# Patient Record
Sex: Female | Born: 1965 | Race: White | Hispanic: No | Marital: Single | Smoking: Current every day smoker
Health system: Southern US, Community
[De-identification: ages and names within clinical notes are randomized; demographics above are authoritative.]

## PROBLEM LIST (undated history)

## (undated) DIAGNOSIS — C801 Malignant (primary) neoplasm, unspecified: Secondary | ICD-10-CM

## (undated) HISTORY — DX: Malignant (primary) neoplasm, unspecified: C80.1

## (undated) HISTORY — PX: ABDOMINAL HYSTERECTOMY: SHX81

## (undated) HISTORY — PX: ELBOW SURGERY: SHX618

---

## 2005-01-28 ENCOUNTER — Emergency Department: Payer: Self-pay | Admitting: Emergency Medicine

## 2005-01-29 ENCOUNTER — Emergency Department (HOSPITAL_COMMUNITY): Admission: EM | Admit: 2005-01-29 | Discharge: 2005-01-29 | Payer: Self-pay | Admitting: Emergency Medicine

## 2006-05-04 ENCOUNTER — Ambulatory Visit: Payer: Self-pay | Admitting: Internal Medicine

## 2010-10-03 HISTORY — PX: AUGMENTATION MAMMAPLASTY: SUR837

## 2013-11-07 ENCOUNTER — Emergency Department: Payer: Self-pay | Admitting: Emergency Medicine

## 2014-07-04 ENCOUNTER — Emergency Department: Payer: Self-pay | Admitting: Emergency Medicine

## 2014-09-17 ENCOUNTER — Ambulatory Visit: Payer: Self-pay

## 2015-10-04 HISTORY — PX: MASTECTOMY: SHX3

## 2015-10-04 HISTORY — PX: AUGMENTATION MAMMAPLASTY: SUR837

## 2015-11-23 ENCOUNTER — Ambulatory Visit: Payer: Self-pay

## 2015-11-25 ENCOUNTER — Ambulatory Visit: Payer: Self-pay | Attending: Oncology

## 2015-11-25 ENCOUNTER — Other Ambulatory Visit: Payer: Self-pay | Admitting: Oncology

## 2015-11-25 ENCOUNTER — Ambulatory Visit
Admission: RE | Admit: 2015-11-25 | Discharge: 2015-11-25 | Disposition: A | Discharge status: Home / Self Care | Payer: Self-pay | Source: Ambulatory Visit | Attending: Oncology | Admitting: Oncology

## 2015-11-25 VITALS — BP 137/87 | HR 102 | Temp 99.6°F | Resp 16 | Ht 66.54 in | Wt 152.2 lb

## 2015-11-25 DIAGNOSIS — Z0000 Encounter for general adult medical examination without abnormal findings: Secondary | ICD-10-CM

## 2015-11-25 NOTE — Progress Notes (Signed)
Subjective:     Patient ID: Abigail Reeves, female   DOB: Feb 12, 1966, 50 y.o.   MRN: [id number]  HPI   Review of Systems     Objective:   Physical Exam  Pulmonary/Chest: Right breast exhibits no inverted nipple, no mass, no nipple discharge, no skin change and no tenderness. Left breast exhibits no inverted nipple, no mass, no nipple discharge, no skin change and no tenderness. Breasts are symmetrical.    Bilateral breast implants       Assessment:    50 year oldpatient presents for BCCCP clinic visit.  Patient screened, and meets BCCCP eligibility.  Patient does not have insurance, Medicare or Medicaid.  Handout given on Affordable Care Act.  Instructed patient on breast self-exam using teach back method.  CBE unremarkable. No mass or lump palpated.   Patient has strong family history of cancer. Mother, maternal aunt had breast cancer. Mother also had pancreatic cancer.  Interested in genetic testing.  Planning to establish care with  primary care physician to obtain referral to [facility] here.    Plan:     Sent for bilateral screening mammogram.

## 2015-12-02 ENCOUNTER — Other Ambulatory Visit: Payer: Self-pay

## 2015-12-02 DIAGNOSIS — N63 Unspecified lump in unspecified breast: Secondary | ICD-10-CM

## 2015-12-11 ENCOUNTER — Ambulatory Visit
Admission: RE | Admit: 2015-12-11 | Discharge: 2015-12-11 | Disposition: A | Discharge status: Home / Self Care | Payer: Self-pay | Source: Ambulatory Visit | Attending: Oncology | Admitting: Oncology

## 2015-12-11 DIAGNOSIS — N63 Unspecified lump in unspecified breast: Secondary | ICD-10-CM

## 2015-12-14 ENCOUNTER — Other Ambulatory Visit: Payer: Self-pay | Admitting: *Deleted

## 2015-12-14 DIAGNOSIS — N63 Unspecified lump in unspecified breast: Secondary | ICD-10-CM

## 2015-12-16 ENCOUNTER — Ambulatory Visit
Admission: RE | Admit: 2015-12-16 | Discharge: 2015-12-16 | Disposition: A | Discharge status: Home / Self Care | Payer: Self-pay | Source: Ambulatory Visit | Attending: Oncology | Admitting: Oncology

## 2015-12-16 DIAGNOSIS — N63 Unspecified lump in unspecified breast: Secondary | ICD-10-CM

## 2015-12-16 HISTORY — PX: BREAST BIOPSY: SHX20

## 2015-12-17 ENCOUNTER — Telehealth: Payer: Self-pay | Admitting: *Deleted

## 2015-12-17 NOTE — Telephone Encounter (Signed)
Received called pathology report from [facility], RN that the right breast biopsy was invasive mammary carcinoma, grade 3, with presence of DCIS.  ER/PR/Her2 will be sent for testing.  Called and informed patient of her results.  Asked if she'd prefer to come in for her results or over the phone.  Patient wanted them via phone today.  She is scheduled to see Dr. Grayland Ormond on Wednesday, March 22 @ 4:00.  Informed her we would completed Medicaid paperwork at that time and discuss genetic testing.  She is to call if she has any questions or needs.

## 2015-12-18 DIAGNOSIS — Z853 Personal history of malignant neoplasm of breast: Secondary | ICD-10-CM | POA: Insufficient documentation

## 2015-12-23 ENCOUNTER — Encounter: Payer: Self-pay | Admitting: Oncology

## 2015-12-23 ENCOUNTER — Encounter (INDEPENDENT_AMBULATORY_CARE_PROVIDER_SITE_OTHER): Payer: Self-pay

## 2015-12-23 ENCOUNTER — Inpatient Hospital Stay: Payer: Medicaid Other | Attending: Oncology | Admitting: Oncology

## 2015-12-23 VITALS — BP 156/92 | HR 101 | Temp 98.9°F | Ht 66.5 in | Wt 149.8 lb

## 2015-12-23 DIAGNOSIS — C50319 Malignant neoplasm of lower-inner quadrant of unspecified female breast: Secondary | ICD-10-CM

## 2015-12-23 DIAGNOSIS — C50311 Malignant neoplasm of lower-inner quadrant of right female breast: Secondary | ICD-10-CM | POA: Diagnosis present

## 2015-12-23 DIAGNOSIS — Z171 Estrogen receptor negative status [ER-]: Secondary | ICD-10-CM | POA: Diagnosis not present

## 2015-12-23 DIAGNOSIS — F17210 Nicotine dependence, cigarettes, uncomplicated: Secondary | ICD-10-CM | POA: Diagnosis not present

## 2015-12-23 LAB — SURGICAL PATHOLOGY

## 2015-12-23 NOTE — Progress Notes (Signed)
No other concerns other than breast cancer

## 2015-12-25 ENCOUNTER — Ambulatory Visit: Payer: Self-pay

## 2015-12-25 ENCOUNTER — Encounter: Payer: Self-pay | Admitting: General Surgery

## 2015-12-25 ENCOUNTER — Ambulatory Visit (INDEPENDENT_AMBULATORY_CARE_PROVIDER_SITE_OTHER): Payer: Medicaid Other | Admitting: General Surgery

## 2015-12-25 VITALS — BP 124/78 | HR 78 | Resp 14 | Ht 66.0 in | Wt 150.0 lb

## 2015-12-25 DIAGNOSIS — C50919 Malignant neoplasm of unspecified site of unspecified female breast: Secondary | ICD-10-CM | POA: Insufficient documentation

## 2015-12-25 DIAGNOSIS — N6001 Solitary cyst of right breast: Secondary | ICD-10-CM | POA: Diagnosis not present

## 2015-12-25 DIAGNOSIS — N63 Unspecified lump in breast: Secondary | ICD-10-CM | POA: Diagnosis not present

## 2015-12-25 DIAGNOSIS — N6310 Unspecified lump in the right breast, unspecified quadrant: Secondary | ICD-10-CM

## 2015-12-25 DIAGNOSIS — C50911 Malignant neoplasm of unspecified site of right female breast: Secondary | ICD-10-CM | POA: Diagnosis not present

## 2015-12-25 DIAGNOSIS — N6009 Solitary cyst of unspecified breast: Secondary | ICD-10-CM | POA: Insufficient documentation

## 2015-12-25 NOTE — Progress Notes (Signed)
Patient ID: Abigail Reeves, female   DOB: 1966/05/13, 50 y.o.   MRN: [id number]  Chief Complaint  Patient presents with  . Other    eval breast cancer    HPI Abigail Reeves is a 50 y.o. female. who presents for a breast evaluation. The most recent mammogram and right breast ultrasound was done on 12/11/15 and right breast biopsy done on 12/16/15. Patient does perform regular self breast checks and gets regular mammograms done. Prior mammogram was in fall 2015. Strong family history of breast cancer.  The patient had been evaluated by Delight Hoh, M.D. for medical oncology earlier this week. His notes are not complete at the time of today's visit.  The patient reports she has 2 children, a son and daughter, the son lives in [place] and the daughter lives locally.  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  While her father attended her today, he was not involved in the interview or consultation.    HPI  No past medical history on file.  Past Surgical History  Procedure Laterality Date  . Augmentation mammaplasty Bilateral 2012  . Elbow surgery Left   . Abdominal hysterectomy      Family History  Problem Relation Age of Onset  . Breast cancer Mother     14  . Pancreatic cancer Mother     37  . Breast cancer Maternal Aunt     63  . Breast cancer Maternal Grandmother     ?    Social History Social History  Substance Use Topics  . Smoking status: Current Every Day Smoker -- 1.00 packs/day for 25 years    Types: Cigarettes  . Smokeless tobacco: None  . Alcohol Use: 12.0 oz/week    20 Cans of beer per week    No Known Allergies  No current outpatient prescriptions on file.   No current facility-administered medications for this visit.    Review of Systems Review of Systems  Constitutional: Negative.   Respiratory: Negative.   Cardiovascular: Negative.     Blood pressure 124/78, pulse 78, resp. rate 14, height  5\' 6"  (1.676 m), weight 150 lb (68.04 kg), last menstrual period 11/24/1997.  Physical Exam Physical Exam  Constitutional: She is oriented to person, place, and time. She appears well-developed and well-nourished.  Eyes: Conjunctivae are normal. No scleral icterus.  Neck: Neck supple.  Cardiovascular: Normal rate, regular rhythm and normal heart sounds.   Pulmonary/Chest: Effort normal. She has wheezes in the left lower field. Right breast exhibits mass. Right breast exhibits no inverted nipple, no nipple discharge, no skin change and no tenderness. Left breast exhibits no inverted nipple, no mass, no nipple discharge, no skin change and no tenderness.    Right breast 6 o'clock bruising .  Lymphadenopathy:    She has no cervical adenopathy.    She has no axillary adenopathy.  Neurological: She is alert and oriented to person, place, and time.  Skin: Skin is dry.    Data Reviewed Screening mammograms dated 11/25/2015 suggested asymmetry in the inferior aspect of the right breast for which additional views were suggested. BI-RADS-0.  Diagnostic mammograms dated 12/11/2015 the just the focal asymmetry with a few calcifications. Multiple circumscribed masses noted throughout the right breast.  Ultrasound of the same date showed a lesion at the 5:00 position 3 cm from the nipple which was hypoechoic and adjacent area in aggregate measuring 0.8 x 1.1 x 1.7 cm. The axilla was negative.  BI-RADS-4C. Ultrasound-guided biopsy completed 12/11/2015 in postbiopsy images were reviewed.  Pathology showed a histologic grade 3 triple negative tumor measuring up to 0.7 cm in the submitted sample.  Ultrasound examination of the right breast was completed to help determine surgical options. There are several cystic lesions noted throughout the breast. In the axillary tail the 11:00 position, 8 cm from the nipple at the end of a mildly prominent duct is a 0.3 x 0.56 x 0.74 hypoechoic/anechoic lesion with  posterior acoustic enhancement. This is in continuity with an adjacent duct. At the 4:00 position 2 cm from the nipple a anechoic area without discernible enhancement measures 0.2 x 0.5 x 0.5 cm. In the upper-outer quadrant the duct dilatation is noted up to 0.28 cm.  At the 3:00 position 1 cm from the nipple a 0.3 x 0.46 x 0.6 cm anechoic lesion consistent with a simple cyst is identified. In the 12:00 position a well-defined anechoic nodule posterior acoustic shadowing measuring 0.35 x 0.38 x 0.43 cm is appreciated.  At the 6:00 position at the site of recent biopsy, 2 cm from the nipple a 0.8 x 1.25 x 1.74 cm mass is appreciated this abuts the pectoralis fascia. No discernible shadowing. Site of previous biopsy. BI-RADS-6.    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 This has previously been scheduled by Dr. Grayland Ormond for next week.  Options for surgical management were reviewed. Considering the patient had implants placed 3 years ago the idea of mastectomy without reconstruction as unappealing. The multiple lesions throughout the right breast are likely cystic and options for management would include: 1) wide excision and postoperative radiation recognizing possible hardening of the capsule of the prosthesis and need for subsequent removal versus 2) mastectomy with or without nipple sparing followed by exchange of the implant for a larger size making radiation unnecessary. ( intraoperative assessment of the retroareolar tissue would be required to determine if nipple sparing was appropriate).  Role of sentinel node biopsy needed procedure was discussed.  The patient brought up contralateral mastectomy. The consensus report from the [employer] was reviewed stating that there is no indication for contralateral mastectomy unless cancer was identified.  At the present time the patient is  leaning towards mastectomy with immediate reconstruction.  Arrangements will be made for plastic surgery consultation. Based on the results of her MRI for the left breast would make plans for a right skin sparing mastectomy with immediate reconstruction at a convenient date.  Approximately one hour was spent reviewing treatment options.        This information has been scribed by Gaspar Cola CMA.  PCP:  Cephas Darby, Forest Gleason 12/25/2015, 9:24 AM

## 2015-12-30 ENCOUNTER — Encounter: Payer: Self-pay | Admitting: General Surgery

## 2015-12-30 ENCOUNTER — Ambulatory Visit (INDEPENDENT_AMBULATORY_CARE_PROVIDER_SITE_OTHER): Payer: Medicaid Other | Admitting: General Surgery

## 2015-12-30 VITALS — BP 136/88 | HR 88 | Resp 12 | Ht 66.0 in | Wt 147.0 lb

## 2015-12-30 DIAGNOSIS — C50911 Malignant neoplasm of unspecified site of right female breast: Secondary | ICD-10-CM | POA: Diagnosis not present

## 2015-12-30 NOTE — Progress Notes (Addendum)
Patient ID: Abigail Reeves, female   DOB: [birth year], 50 y.o.   MRN: [id number]  Chief Complaint  Patient presents with  . Other    discussion    HPI Abigail Reeves is a 50 y.o. female.  Here today to discuss manage of breat cancer. She is here today with her best friend Abigail Reeves.  HPI  No past medical history on file.  Past Surgical History  Procedure Laterality Date  . Augmentation mammaplasty Bilateral 2012  . Elbow surgery Left   . Abdominal hysterectomy    . Breast biopsy Right 12-16-15    INVASIVE MAMMARY CARCINOMA OF NO SPECIAL TYPE    Family History  Problem Relation Age of Onset  . Breast cancer Mother     82  . Pancreatic cancer Mother     30  . Breast cancer Maternal Aunt     63  . Breast cancer Maternal Grandmother     ?    Social History Social History  Substance Use Topics  . Smoking status: Current Every Day Smoker -- 1.00 packs/day for 25 years    Types: Cigarettes  . Smokeless tobacco: Never Used  . Alcohol Use: 12.0 oz/week    20 Cans of beer per week    No Known Allergies  No current outpatient prescriptions on file.   No current facility-administered medications for this visit.    Review of Systems Review of Systems  Constitutional: Negative.   Respiratory: Negative.   Cardiovascular: Negative.     Blood pressure 136/88, pulse 88, resp. rate 12, height 5\' 6"  (1.676 m), weight 147 lb (66.679 kg), last menstrual period 11/24/1997.  Physical Exam Physical Exam Not performed.  Data Reviewed No new data.   Assessment    Breast cancer.      Plan    Plastic surgery not available locally secondary to [facility] insurance.  Options reviewed: Referral to university setting (patient's family had good [facility] at [facility]) Mastectomy w/ o implant exchange, likely resulting in a 1/2 to 1 cup size discrepancy  Lumpectomy w/ post op RT. 30% chance of capsular contraction of present prosthesis.  Patient has requested university  referral. This will be coordinated with the [place] team.      PCP:  Frazier Richards This information has been scribed by Karie Fetch RN, BSN,BC.    Robert Bellow 01/11/2016, 8:55 PM

## 2015-12-30 NOTE — Progress Notes (Signed)
  Oncology Nurse Navigator Documentation  Navigator Location: [facility]-Med Onc (12/30/15 1200) Navigator Encounter Type: Initial MedOnc;Telephone;Clinic/MDC;Education;Genetics (12/30/15 1200)   Abnormal Finding Date: 12/16/15 (12/30/15 1200) Confirmed Diagnosis Date: 12/16/15 (12/30/15 1200)     Patient Visit Type: MedOnc;Initial;Surgery (12/30/15 1200) Treatment Phase: Pre-Tx/Tx Discussion (12/30/15 1200) Barriers/Navigation Needs: Financial;Family concerns;Education;Coordination of Care (12/30/15 1200) Education: Accessing Care/ Finding Providers;Understanding Cancer/ Treatment Options;Coping with Diagnosis/ Prognosis;Preparing for Upcoming Surgery/ Treatment;Newly Diagnosed Cancer Education;Concerns with Insurance Coverage;Concerns with Finances/ Eligibility (12/30/15 1200) Interventions: Referrals;Coordination of Care;Education Method (12/30/15 1200)     Education Method: Teach-back;Verbal;Written;Demonstration (12/30/15 1200)      Acuity: Level 3 (12/30/15 1200)     Acuity Level 3: Coordination of multimodality treatment;Emotional needs;Ongoing guidance and education provided throughout treatment (12/30/15 1200)   Time Spent with Patient: > 120 (12/30/15 1200)   Patient came through [facility] prior to diagnosis.  Met with her, and family members at initial Med Onc visit.  Given Brast Cancer Treatment [facility]And book.  Coordinating B[facility] Medicaid, genetic testing. Going for MUGA, and MRI this week.

## 2015-12-31 ENCOUNTER — Encounter: Payer: Self-pay | Admitting: Oncology

## 2015-12-31 ENCOUNTER — Ambulatory Visit
Admission: RE | Admit: 2015-12-31 | Discharge: 2015-12-31 | Disposition: A | Discharge status: Home / Self Care | Payer: Medicaid Other | Source: Ambulatory Visit | Attending: Oncology | Admitting: Oncology

## 2015-12-31 DIAGNOSIS — C50319 Malignant neoplasm of lower-inner quadrant of unspecified female breast: Secondary | ICD-10-CM | POA: Insufficient documentation

## 2015-12-31 MED ORDER — TECHNETIUM TC 99M-LABELED RED BLOOD CELLS IV KIT: 20.0000 | PACK | Freq: Once | INTRAVENOUS | Status: DC | PRN

## 2015-12-31 NOTE — Progress Notes (Signed)
  Oncology Nurse Navigator Documentation  Navigator Location: [facility] Onc (12/31/15 1700) Navigator Encounter Type: Genetics;Education (Financial) (12/31/15 1700)           Patient Visit Type: Follow-up (12/31/15 1700) Treatment Phase: Pre-Tx/Tx Discussion (12/31/15 1700) Barriers/Navigation Needs: Financial;Education;Coordination of Care (12/31/15 1700) Education: Concerns with Finances/ Eligibility;Concerns with Insurance Coverage (genetics) (12/31/15 1700)       Education Method: Written;Verbal;Teach-back (12/31/15 1700)      Acuity: Level 2 (12/31/15 1700)   Acuity Level 2: Initial guidance, education and coordination as needed;Referrals such as genetics, survivorship;Ongoing guidance and education throughout treatment as needed (12/31/15 1700)     Time Spent with Patient: 60 (12/31/15 1700)   Genetic testing for MYRISK obtained, and [facility] paperwork completed.  Patient scheduled for breast MRI tomorrow.

## 2016-01-01 ENCOUNTER — Encounter: Payer: Self-pay | Admitting: Oncology

## 2016-01-01 ENCOUNTER — Ambulatory Visit (HOSPITAL_COMMUNITY)
Admission: RE | Admit: 2016-01-01 | Discharge: 2016-01-01 | Disposition: A | Discharge status: Home / Self Care | Payer: Medicaid Other | Source: Ambulatory Visit | Attending: Oncology | Admitting: Oncology

## 2016-01-01 ENCOUNTER — Other Ambulatory Visit: Payer: Self-pay | Admitting: Oncology

## 2016-01-01 DIAGNOSIS — C50319 Malignant neoplasm of lower-inner quadrant of unspecified female breast: Secondary | ICD-10-CM

## 2016-01-01 MED ORDER — GADOBENATE DIMEGLUMINE 529 MG/ML IV SOLN
15.0000 mL | Freq: Once | INTRAVENOUS | Status: AC | PRN
  Administered 2016-01-01: 14 mL via INTRAVENOUS

## 2016-01-04 ENCOUNTER — Encounter: Payer: Self-pay | Admitting: Diagnostic Radiology

## 2016-01-05 NOTE — Progress Notes (Signed)
[place]  Telephone:([phone]) [phone] Fax:(336) [phone]  ID: LY WASS OB: Sep 27, 1966  MR#: [id number]  [id number]#:[id number]  Patient Care Team: Kirk Ruths, MD as PCP - General (Internal Medicine) Rico Junker, RN as Registered Nurse Theodore Demark, RN as Registered Nurse Lloyd Huger, MD as Consulting Physician (Oncology) Robert Bellow, MD (General Surgery)  CHIEF COMPLAINT:  Chief Complaint  Patient presents with  . New Evaluation    Breast cancer Right side    INTERVAL HISTORY: Patient is a 50 year old female with history of bilateral breast implants who was found to have an abnormality on routine screening mammogram. 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 Currently, she is anxious but otherwise feels well. She has no neurologic complaints. She denies any recent fevers or illnesses. She has good appetite and denies weight loss. She denies any pain. She has no chest pain or shortness of breath. She denies any nausea, vomiting, constipation, or diarrhea. She has no urinary complaints. Patient offers no further specific complaints.  REVIEW OF SYSTEMS:   Review of Systems  Constitutional: Negative.  Negative for fever, weight loss and malaise/fatigue.  Respiratory: Negative.  Negative for shortness of breath.   Cardiovascular: Negative.  Negative for chest pain.  Gastrointestinal: Negative.   Genitourinary: Negative.   Musculoskeletal: Negative.   Neurological: Negative.  Negative for weakness.  Psychiatric/Behavioral: The patient is nervous/anxious.     As per HPI. Otherwise, a complete review of systems is negatve.  PAST MEDICAL HISTORY: No past medical history on file.  PAST SURGICAL HISTORY: Past Surgical History  Procedure Laterality Date  . Augmentation mammaplasty Bilateral 2012  . Elbow surgery Left   . Abdominal hysterectomy    . Breast biopsy Right 12-16-15    INVASIVE MAMMARY  CARCINOMA OF NO SPECIAL TYPE    FAMILY HISTORY Family History  Problem Relation Age of Onset  . Breast cancer Mother     11  . Pancreatic cancer Mother     38  . Breast cancer Maternal Aunt     63  . Breast cancer Maternal Grandmother     ?       ADVANCED DIRECTIVES:    HEALTH MAINTENANCE: Social History  Substance Use Topics  . Smoking status: Current Every Day Smoker -- 1.00 packs/day for 25 years    Types: Cigarettes  . Smokeless tobacco: Never Used  . Alcohol Use: 12.0 oz/week    20 Cans of beer per week     Colonoscopy:  PAP:  Bone density:  Lipid panel:  No Known Allergies  No current outpatient prescriptions on file.   No current facility-administered medications for this visit.    OBJECTIVE: Filed Vitals:   12/23/15 1622  BP: 156/92  Pulse: 101  Temp: 98.9 F (37.2 C)     Body mass index is 23.82 kg/(m^2).    ECOG FS:0 - Asymptomatic  General: Well-developed, well-nourished, no acute distress. Eyes: Pink conjunctiva, anicteric sclera. HEENT: Normocephalic, moist mucous membranes, clear oropharnyx. Breasts: Bilateral breast implants. Lungs: Clear to auscultation bilaterally. Heart: Regular rate and rhythm. No rubs, murmurs, or gallops. Abdomen: Soft, nontender, nondistended. No organomegaly noted, normoactive bowel sounds. Musculoskeletal: No edema, cyanosis, or clubbing. Neuro: Alert, answering all questions appropriately. Cranial nerves grossly intact. Skin: No rashes or petechiae noted. Psych: Normal affect. Lymphatics: No cervical, calvicular, axillary or inguinal LAD.   LAB RESULTS:  No results found for: NA, K, CL, CO2, GLUCOSE, BUN, CREATININE, CALCIUM,  PROT, ALBUMIN, AST, ALT, ALKPHOS, BILITOT, GFRNONAA, GFRAA  No results found for: WBC, NEUTROABS, HGB, HCT, MCV, PLT   STUDIES: Nm Cardiac Muga Rest  12/31/2015  CLINICAL DATA:  Breast cancer. Evaluate cardiac function in relation to chemotherapy. EXAM: NUCLEAR MEDICINE CARDIAC  BLOOD POOL IMAGING (MUGA) TECHNIQUE: Cardiac multi-gated acquisition was performed at rest following intravenous injection of Tc-9mlabeled red blood cells. RADIOPHARMACEUTICALS:  20.0 mCi Tc-99mDP in-vitro labeled red blood cells IV COMPARISON:  None. FINDINGS: No  focal wall motion abnormality of the left ventricle. Calculated left ventricular ejection fraction equals 67% IMPRESSION: Left ventricular ejection fraction equals67% Electronically Signed   By: StSuzy Bouchard.D.   On: 12/31/2015 15:58   Mr Breast Bilateral W Wo Contrast  01/04/2016  CLINICAL DATA:  Recent diagnosis of invasive mammary carcinoma of the right breast following ultrasound-guided biopsy of a 1.1 cm mass at 5 o'clock position. LABS:  None performed EXAM: BILATERAL BREAST MRI WITH AND WITHOUT CONTRAST TECHNIQUE: Multiplanar, multisequence MR images of both breasts were obtained prior to and following the intravenous administration of 18 ml of MultiHance. THREE-DIMENSIONAL MR IMAGE RENDERING ON INDEPENDENT WORKSTATION: Three-dimensional MR images were rendered by post-processing of the original MR data on an independent workstation. The three-dimensional MR images were interpreted, and findings are reported in the following complete MRI report for this study. Three dimensional images were evaluated at the independent DynaCad workstation COMPARISON:  Previous exam(s). FINDINGS: Breast composition: c. Heterogeneous fibroglandular tissue. Background parenchymal enhancement: Moderate. Right breast: There is a 1.2 x 1.1 x 1.0 cm irregular enhancing mass with central biopsy clip artifact in the 5 o'clock region of the right breast. Biopsy clip artifact is seen within the mass. No additional suspicious masses in the right breast. Left breast: No suspicious mass or suspicious area of enhancement. Lymph nodes: No abnormal appearing lymph nodes. Ancillary findings: Bilateral subpectoral silicone breast implants are present. Please note that this  study is not tailored to evaluate for integrity of the implants. IMPRESSION: 1.2 x 1.1 x 1.0 cm biopsy-proven malignancy at 5 o'clock region of the right breast. No MRI evidence of malignancy in the left breast. Negative for lymphadenopathy. RECOMMENDATION: Treatment planning. BI-RADS CATEGORY  6: Known biopsy-proven malignancy. Electronically Signed   By: SuCurlene Dolphin.D.   On: 01/04/2016 11:18   Mm Digital Diagnostic Unilat R  12/16/2015  CLINICAL DATA:  Status post ultrasound-guided core biopsy of mass in the right breast. EXAM: DIAGNOSTIC RIGHT MAMMOGRAM POST ULTRASOUND BIOPSY COMPARISON:  Previous exam(s). FINDINGS: Mammographic images were obtained following ultrasound guided biopsy of a mass in the 5 o'clock region of the right breast. Mammographic images show there is a ribbon shaped clip associated with mass in the 5 o'clock region of the right breast. IMPRESSION: Status post ultrasound-guided core biopsy of a right breast mass with pathology pending. Final Assessment: Post Procedure Mammograms for Marker Placement Electronically Signed   By: DiLillia Mountain.D.   On: 12/16/2015 10:46   UsKoreareast Complete Uni Right Inc Axilla  12/25/2015  Ultrasound examination of the right breast was completed to help determine surgical options. There are several cystic lesions noted throughout the breast. In the axillary tail the 11:00 position, 8 cm from the nipple at the end of a mildly prominent duct is a 0.3 x 0.56 x 0.74 hypoechoic/anechoic lesion with posterior acoustic enhancement. This is in continuity with an adjacent duct. At the 4:00 position 2 cm from the nipple a anechoic area without discernible enhancement measures  0.2 x 0.5 x 0.5 cm. In the upper-outer quadrant the duct dilatation is noted up to 0.28 cm. At the 3:00 position 1 cm from the nipple a 0.3 x 0.46 x 0.6 cm anechoic lesion consistent with a simple cyst is identified. In the 12:00 position a well-defined anechoic nodule posterior acoustic  shadowing measuring 0.35 x 0.38 x 0.43 cm is appreciated. At the 6:00 position at the site of recent biopsy, 2 cm from the nipple a 0.8 x 1.25 x 1.74 cm mass is appreciated this abuts the pectoralis fascia. No discernible shadowing. Site of previous biopsy. BI-RADS-6.   US Breast Ltd Uni Right Inc Axilla  12/11/2015  CLINICAL DATA:  Screening recall for a possible right breast mass. The patient has strong family history for breast cancer. EXAM: DIGITAL DIAGNOSTIC RIGHT MAMMOGRAM WITH 3D TOMOSYNTHESIS AND CAD RIGHT BREAST ULTRASOUND COMPARISON:  Previous exam(s). ACR Breast Density Category c: The breast tissue is heterogeneously dense, which may obscure small masses. FINDINGS: In the slightly lower inner quadrant of the right breast, middle depth there is a focal asymmetry/possible mass associated with a few calcifications. Several circumscribed masses are seen elsewhere throughout the right breast. Mammographic images were processed with CAD. Physical exam of the lower-inner quadrant of the right breast demonstrates a firm palpable mass. Ultrasound targeted to the palpable area of concern at 5 o'clock, 3 cm from the nipple demonstrates a hypoechoic mass with indistinct margins and several echogenic internal foci corresponding with the calcifications seen mammographically. This mass measures approximately 1.1 x 1.1 x 0.8 cm in corresponds with the mammographic mass of concern. There is an adjacent hypoechoic oval mass which may represent a cyst, though the possibility that this is a part of the larger mass cannot be excluded. If this smaller mass is included with the measurements with the dominant larger mass, then the mass would measure 1.1 x 0.8 x 1.7 cm. The right axilla was imaged demonstrating normal appearing lymph nodes. IMPRESSION: 1.  There is a suspicious mass in the right breast at 5 o'clock. 2.  No evidence of right axillary lymphadenopathy. RECOMMENDATION: 1. Ultrasound-guided biopsy is recommended  for the right breast mass at 5 o'clock. 2. Following ultrasound-guided biopsy, bilateral breast MRI is recommended for this high risk patient with strong family history of breast cancer and dense breast tissue. Additionally, per American Cancer Society guidelines, if the patient has a calculated lifetime risk of developing breast cancer of greater than 20%, annual screening MRI of the breasts would be recommended at the time of screening mammography. I have discussed the findings and recommendations with the patient. Results were also provided in writing at the conclusion of the visit. If applicable, a reminder letter will be sent to the patient regarding the next appointment. BI-RADS CATEGORY  Category 4C: High suspicion for malignancy. Electronically Signed   By: Ammie Ferrier M.D.   On: 12/11/2015 11:30   Mm Diag Breast Tomo Uni Right  12/11/2015  CLINICAL DATA:  Screening recall for a possible right breast mass. The patient has strong family history for breast cancer. EXAM: DIGITAL DIAGNOSTIC RIGHT MAMMOGRAM WITH 3D TOMOSYNTHESIS AND CAD RIGHT BREAST ULTRASOUND COMPARISON:  Previous exam(s). ACR Breast Density Category c: The breast tissue is heterogeneously dense, which may obscure small masses. FINDINGS: In the slightly lower inner quadrant of the right breast, middle depth there is a focal asymmetry/possible mass associated with a few calcifications. Several circumscribed masses are seen elsewhere throughout the right breast. Mammographic images were processed with  CAD. Physical exam of the lower-inner quadrant of the right breast demonstrates a firm palpable mass. Ultrasound targeted to the palpable area of concern at 5 o'clock, 3 cm from the nipple demonstrates a hypoechoic mass with indistinct margins and several echogenic internal foci corresponding with the calcifications seen mammographically. This mass measures approximately 1.1 x 1.1 x 0.8 cm in corresponds with the mammographic mass of  concern. There is an adjacent hypoechoic oval mass which may represent a cyst, though the possibility that this is a part of the larger mass cannot be excluded. If this smaller mass is included with the measurements with the dominant larger mass, then the mass would measure 1.1 x 0.8 x 1.7 cm. The right axilla was imaged demonstrating normal appearing lymph nodes. IMPRESSION: 1.  There is a suspicious mass in the right breast at 5 o'clock. 2.  No evidence of right axillary lymphadenopathy. RECOMMENDATION: 1. Ultrasound-guided biopsy is recommended for the right breast mass at 5 o'clock. 2. Following ultrasound-guided biopsy, bilateral breast MRI is recommended for this high risk patient with strong family history of breast cancer and dense breast tissue. Additionally, per American Cancer Society guidelines, if the patient has a calculated lifetime risk of developing breast cancer of greater than 20%, annual screening MRI of the breasts would be recommended at the time of screening mammography. I have discussed the findings and recommendations with the patient. Results were also provided in writing at the conclusion of the visit. If applicable, a reminder letter will be sent to the patient regarding the next appointment. BI-RADS CATEGORY  Category 4C: High suspicion for malignancy. Electronically Signed   By: Ammie Ferrier M.D.   On: 12/11/2015 11:30   [place] Rt Breast Bx W Loc Dev 1st Lesion Img Bx Spec US Guide  12/28/2015  ADDENDUM REPORT: 12/18/2015 16:52 ADDENDUM: Pathology of the right breast biopsy revealed INVASIVE MAMMARY CARCINOMA, PRELIMINARY GRADE 3. Results were called to Wynelle Link, nurse in [facility] by pathologist. The results were relayed to Tanya Nones, RN, nurse navigator for the [facility] This was found to be concordant by Dr. Clarise Cruz impression and notes. Recommendations:  Surgical and oncology referral. At the patient's request, she was contacted by phone on 12/18/15  by Jetta Lout, [facility] radiology). She stated she has a bruise but is otherwise fine. Post biopsy instructions were reviewed with the patient. All of her questions were answered. She was encouraged to call the [place] [facility] with any further questions or concerns related to the biopsy site. The patient stated results have been discussed with her. She saw Dr. Ouida Sills today and has an appointment with the oncologist on 12/23/15 at 4:00 PM. Addendum by Jetta Lout, [place] on 12/18/15. Electronically Signed   By: Lillia Mountain M.D.   On: 12/18/2015 16:52  12/28/2015  CLINICAL DATA:  Suspicious right breast mass. EXAM: ULTRASOUND GUIDED RIGHT BREAST CORE NEEDLE BIOPSY COMPARISON:  Previous exam(s). PROCEDURE: I met with the patient and we discussed the procedure of ultrasound-guided biopsy, including benefits and alternatives. We discussed the high likelihood of a successful procedure. We discussed the risks of the procedure including infection, bleeding, tissue injury, clip migration, and inadequate sampling. Informed written consent was given. The usual time-out protocol was performed immediately prior to the procedure. Using sterile technique and 1% Lidocaine as local anesthetic, under direct ultrasound visualization, a 12 gauge vacuum-assisted device was used to perform biopsy of a mass in the 5 o'clock region of  the right breastusing a lateral to medial approach. At the conclusion of the procedure, a ribbon shaped tissue marker clip was deployed into the biopsy cavity. Follow-up 2-view mammogram was performed and dictated separately. IMPRESSION: Ultrasound-guided biopsy of the right breast. No apparent complications. Electronically Signed: By: Lillia Mountain M.D. 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  PLAN:    1. Breast cancer: MRI results reviewed independently and reported as above confirming size of  malignancy. A referral was made to surgery for definitive lumpectomy. Patient may require surgery at a tertiary care center given her breast implants and desire for repeat augmentation. She has an appointment at [facility] on January 13, 2016 with Dr. Stasia Cavalier. Given the fact the patient has a triple negative tumor, she will require adjuvant chemotherapy using Adriamycin, Cytoxan, and Taxol. She also will likely require adjuvant XRT depending on her surgery. Aromatase inhibitor would not be helpful given the triple negative status of her disease. Pretreatment MUGA scan revealed an EF of 67%. Patient will return to clinic in several weeks after her surgery for further evaluation and additional treatment planning. 2. Genetic testing: Given the fact that patient is a 50 years old and triple negative tumor she would qualify for genetic testing. She also has a significant family history of maternal grandmother, maternal aunt as well as a mother all with reported breast cancer.  Approximately 45 minutes was spent in discussion of which greater than 50% was consultation.  Patient expressed understanding and was in agreement with this plan. She also understands that She can call clinic at any time with any questions, concerns, or complaints.    Lloyd Huger, MD   01/05/2016 1:45 PM

## 2016-01-06 ENCOUNTER — Telehealth: Payer: Self-pay | Admitting: *Deleted

## 2016-01-06 NOTE — Progress Notes (Signed)
See Oncology Navigator note.  Copy to [facility].

## 2016-01-06 NOTE — Telephone Encounter (Signed)
Patient was contacted today because she originally stated that she would like a referral to [facility] and we were going to inform her of her appointment date and time.   The patient states that she has already been scheduled for an appointment at [facility] and this was arranged by the folks at the [facility].   Patient will be seeing Dr. Drue Dun on 01-13-16 at [facility].   She is requesting records be forwarded to their office for her appointment. Patient will stop by the office and sign a medical release so we can fax records to their office. She will also obtain fax number of where information needs to be sent.   *Appointment with Dr. Genia Hotter at [facility] has been cancelled per patient's request as she will be going to [facility] for care.

## 2016-01-13 DIAGNOSIS — Z1379 Encounter for other screening for genetic and chromosomal anomalies: Secondary | ICD-10-CM | POA: Insufficient documentation

## 2016-01-13 DIAGNOSIS — Z803 Family history of malignant neoplasm of breast: Secondary | ICD-10-CM | POA: Insufficient documentation

## 2016-01-18 NOTE — Progress Notes (Signed)
  Oncology Nurse Navigator Documentation  Navigator Location: [facility] (01/18/16 1500) Navigator Encounter Type: Genetics (01/18/16 1500)           Patient Visit Type: Follow-up (01/18/16 1500) Treatment Phase: Pre-Tx/Tx Discussion (01/18/16 1500) Barriers/Navigation Needs: Education (01/18/16 1500) Education: Preparing for Upcoming Surgery/ Treatment (01/18/16 1500)       Education Method: Verbal (01/18/16 1500)      Acuity: Level 2 (01/18/16 1500)     Acuity Level 3: Emotional needs;Coordination of multimodality treatment;Ongoing guidance and education provided throughout treatment (01/18/16 1500)   Time Spent with Patient: 30 (01/18/16 1500)  Notified patient of negative MYRISK results.  Continuing to work with DSS related to [facility].  Emailed Marisa Sprinkles at [employer]. She states information has been sent to the [place] office , but approval not yet received. She will follow-up later this week, and will contact [employer] if no results.   Explained to patient.  Per patient Dr. Stasia Cavalier at [facility] is waiting for [facility] results, and Medicaid before scheduling surgery.

## 2016-01-25 ENCOUNTER — Encounter: Payer: Self-pay | Admitting: *Deleted

## 2016-01-25 NOTE — Progress Notes (Signed)
Patient called to see if her Medicaid had been approved.  Called Erskine Squibb, RN, [facility] nurse consultant, and patient was approved on 01/11/16 for 1 year.  Notified patient.  States she has not received anything yet.  Will notify local DSS.

## 2016-02-01 HISTORY — PX: BREAST EXCISIONAL BIOPSY: SUR124

## 2016-02-16 NOTE — Progress Notes (Signed)
  Oncology Nurse Navigator Documentation  Navigator Location: [facility] On[facility] (02/16/16 1000)         Surgery Date: 02/12/16 (02/16/16 1000) Treatment Initiated Date: 02/12/16 (02/16/16 1000) Patient Visit Type: Follow-up (02/16/16 1000) Treatment Phase: Active Tx (02/16/16 1000) Barriers/Navigation Needs: Financial;Coordination of Care (02/16/16 1000)   Interventions: Coordination of Care (02/16/16 1000)   Coordination of Care:  (Smokin Cessation) (02/16/16 1000)      Specialty Items/DME: Prosthesis (02/16/16 1000) Acuity: Level 2 (02/16/16 1000)   Acuity Level 2: Ongoing guidance and education throughout treatment as needed;Referrals such as genetics, survivorship;Assistance expediting appointments;Educational needs (02/16/16 1000)     Time Spent with Patient: 30 (02/16/16 1000)  Phoned patient post mastectomy.  States she is doing better than expected, and has decreased need for pain medication.   She has post surgical appointment this afternoon at [facility].  Reminded her of pre-chemotherapy appointment with Dr. Grayland Ormond on 02/22/16.  Patient states she "has to do somenting about smoking".  Discussed available Smoking Cessation class with patient, and Jeb Levering.   Per Raquel Sarna, next class in July, but he will be glad to get her started with information, and recommendations to aid cessation.  Mailed flier, and contact information.

## 2016-02-22 ENCOUNTER — Inpatient Hospital Stay: Payer: Medicaid Other | Admitting: Oncology

## 2016-02-24 ENCOUNTER — Inpatient Hospital Stay: Payer: Medicaid Other | Admitting: Oncology

## 2016-03-01 ENCOUNTER — Inpatient Hospital Stay: Payer: Medicaid Other | Attending: Oncology | Admitting: Oncology

## 2016-03-01 VITALS — BP 131/87 | HR 98 | Temp 98.2°F | Resp 18 | Wt 153.4 lb

## 2016-03-01 DIAGNOSIS — Z803 Family history of malignant neoplasm of breast: Secondary | ICD-10-CM | POA: Insufficient documentation

## 2016-03-01 DIAGNOSIS — Z79899 Other long term (current) drug therapy: Secondary | ICD-10-CM | POA: Diagnosis not present

## 2016-03-01 DIAGNOSIS — C50911 Malignant neoplasm of unspecified site of right female breast: Secondary | ICD-10-CM | POA: Insufficient documentation

## 2016-03-01 DIAGNOSIS — Z171 Estrogen receptor negative status [ER-]: Secondary | ICD-10-CM | POA: Insufficient documentation

## 2016-03-01 DIAGNOSIS — F17210 Nicotine dependence, cigarettes, uncomplicated: Secondary | ICD-10-CM | POA: Insufficient documentation

## 2016-03-01 DIAGNOSIS — C50319 Malignant neoplasm of lower-inner quadrant of unspecified female breast: Secondary | ICD-10-CM

## 2016-03-01 NOTE — Progress Notes (Signed)
Patient has been evaluated at [facility] oncology and plans to pursue treatment at [facility].

## 2016-03-08 NOTE — Progress Notes (Signed)
[facility]  [phone] [phone]) [phone]  ID: Abigail Reeves OB: October 02, 1966  MR#: [id number]  [id number]  Patient Care Team: Kirk Ruths, MD as PCP - General (Internal Medicine) Rico Junker, RN as Registered Nurse Theodore Demark, RN as Registered Nurse Lloyd Huger, MD as Consulting Physician (Oncology) Robert Bellow, MD (General Surgery)  CHIEF COMPLAINT:  Chief Complaint  Patient presents with  . Breast Cancer    INTERVAL HISTORY: Patient returns to clinic for further evaluation she currently feels well and is asymptomatic. She has no neurologic complaints. She denies any recent fevers or illnesses. She has good appetite and denies weight loss. She denies any pain. She has no chest pain or shortness of breath. She denies any nausea, vomiting, constipation, or diarrhea. She has no urinary complaints. Patient offers no further specific complaints.  REVIEW OF SYSTEMS:   Review of Systems  Constitutional: Negative.  Negative for fever, weight loss and malaise/fatigue.  Respiratory: Negative.  Negative for shortness of breath.   Cardiovascular: Negative.  Negative for chest pain.  Gastrointestinal: Negative.   Genitourinary: Negative.   Musculoskeletal: Negative.   Neurological: Negative.  Negative for weakness.  Psychiatric/Behavioral: The patient is not nervous/anxious.     As per HPI. Otherwise, a complete review of systems is negatve.  PAST MEDICAL HISTORY: No past medical history on file.  PAST SURGICAL HISTORY: Past Surgical History  Procedure Laterality Date  . Augmentation mammaplasty Bilateral 2012  . Elbow surgery Left   . Abdominal hysterectomy    . Breast biopsy Right 12-16-15    INVASIVE MAMMARY CARCINOMA OF NO SPECIAL TYPE    FAMILY HISTORY Family History  Problem Relation Age of Onset  . Breast cancer Mother     72  . Pancreatic cancer Mother     57  . Breast cancer Maternal Aunt     63    . Breast cancer Maternal Grandmother     ?       ADVANCED DIRECTIVES:    HEALTH MAINTENANCE: Social History  Substance Use Topics  . Smoking status: Current Every Day Smoker -- 1.00 packs/day for 25 years    Types: Cigarettes  . Smokeless tobacco: Never Used  . Alcohol Use: 12.0 oz/week    20 Cans of beer per week     Colonoscopy:  PAP:  Bone density:  Lipid panel:  No Known Allergies  Current Outpatient Prescriptions  Medication Sig Dispense Refill  . dexamethasone (DECADRON) 4 MG tablet     . lidocaine-prilocaine (EMLA) cream     . LORazepam (ATIVAN) 1 MG tablet     . ondansetron (ZOFRAN) 8 MG tablet Take by mouth.    . oxyCODONE (OXY IR/ROXICODONE) 5 MG immediate release tablet TK 1 TO 2 TS PO Q 4 H PRN P FOR UP TO 4 DAYS  0  . prochlorperazine (COMPAZINE) 10 MG tablet Take by mouth.    . sulfamethoxazole-trimethoprim (BACTRIM DS,SEPTRA DS) 800-160 MG tablet TK 2 TS PO BID FOR 7 DAYS  0   No current facility-administered medications for this visit.    OBJECTIVE: Filed Vitals:   03/01/16 1617  BP: 131/87  Pulse: 98  Temp: 98.2 F (36.8 C)  Resp: 18     Body mass index is 24.78 kg/(m^2).    ECOG FS:0 - Asymptomatic  General: Well-developed, well-nourished, no acute distress. Eyes: Pink conjunctiva, anicteric sclera. Breasts: Bilateral breast implants. Lungs: Clear to auscultation bilaterally. Heart: Regular rate and rhythm.  No rubs, murmurs, or gallops. Abdomen: Soft, nontender, nondistended. No organomegaly noted, normoactive bowel sounds. Musculoskeletal: No edema, cyanosis, or clubbing. Neuro: Alert, answering all questions appropriately. Cranial nerves grossly intact. Skin: No rashes or petechiae noted. Psych: Normal affect.    LAB RESULTS:  No results found for: NA, K, CL, CO2, GLUCOSE, BUN, CREATININE, CALCIUM, PROT, ALBUMIN, AST, ALT, ALKPHOS, BILITOT, GFRNONAA, GFRAA  No results found for: WBC, NEUTROABS, HGB, HCT, MCV, PLT   STUDIES: No  results found.  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  PLAN:    1. Breast cancer: Patient had consultation at [facility] and has elected to transfer all of her care there. No further intervention is needed. No follow-up has been scheduled.   2. Genetic testing: Given the fact that patient is 50 years old and triple negative tumor she would qualify for genetic testing. She also has a significant family history of maternal grandmother, maternal aunt as well as a mother all with reported breast cancer.   Patient expressed understanding and was in agreement with this plan. She also understands that She can call clinic at any time with any questions, concerns, or complaints.    Lloyd Huger, MD   03/08/2016 9:30 AM

## 2016-10-25 NOTE — Progress Notes (Signed)
  Oncology Nurse Navigator Documentation  Navigator Location: [facility] (10/25/16 1400)   )Navigator Encounter Type: Telephone;Letter/Fax/Email (10/25/16 1400)                     Patient Visit Type: Follow-up (10/25/16 1400)                              Time Spent with Patient: 45 (10/25/16 1400)  Received notification from [place] that [place] expires 11/30/16.  Phoned patient to discuss need for recertification.  She is scheduled for reconstructive surgery at [facility] on 11/14/16, and taes active breast cancer treatment should be complete by end of February. Eiligibility paperwork with treatment complete faxed to [facility].

## 2016-11-23 ENCOUNTER — Encounter: Payer: Self-pay | Admitting: Physician Assistant

## 2016-11-23 ENCOUNTER — Ambulatory Visit (INDEPENDENT_AMBULATORY_CARE_PROVIDER_SITE_OTHER): Payer: Managed Care, Other (non HMO) | Admitting: Physician Assistant

## 2016-11-23 VITALS — BP 106/76 | HR 88 | Temp 98.6°F | Resp 16 | Ht 66.0 in | Wt 154.8 lb

## 2016-11-23 DIAGNOSIS — Z7689 Persons encountering health services in other specified circumstances: Secondary | ICD-10-CM | POA: Diagnosis not present

## 2016-11-23 DIAGNOSIS — C50311 Malignant neoplasm of lower-inner quadrant of right female breast: Secondary | ICD-10-CM

## 2016-11-23 DIAGNOSIS — Z131 Encounter for screening for diabetes mellitus: Secondary | ICD-10-CM | POA: Diagnosis not present

## 2016-11-23 DIAGNOSIS — F1010 Alcohol abuse, uncomplicated: Secondary | ICD-10-CM | POA: Insufficient documentation

## 2016-11-23 DIAGNOSIS — H9192 Unspecified hearing loss, left ear: Secondary | ICD-10-CM | POA: Insufficient documentation

## 2016-11-23 DIAGNOSIS — Z171 Estrogen receptor negative status [ER-]: Secondary | ICD-10-CM | POA: Diagnosis not present

## 2016-11-23 DIAGNOSIS — R5383 Other fatigue: Secondary | ICD-10-CM | POA: Insufficient documentation

## 2016-11-23 DIAGNOSIS — F5101 Primary insomnia: Secondary | ICD-10-CM | POA: Insufficient documentation

## 2016-11-23 DIAGNOSIS — Z136 Encounter for screening for cardiovascular disorders: Secondary | ICD-10-CM

## 2016-11-23 DIAGNOSIS — F17209 Nicotine dependence, unspecified, with unspecified nicotine-induced disorders: Secondary | ICD-10-CM | POA: Insufficient documentation

## 2016-11-23 DIAGNOSIS — Z13220 Encounter for screening for lipoid disorders: Secondary | ICD-10-CM | POA: Diagnosis not present

## 2016-11-23 MED ORDER — CLONAZEPAM 0.5 MG PO TABS: 0.5000 mg | ORAL_TABLET | Freq: Every day | ORAL | 1 refills | Status: DC
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
## 2016-11-23 NOTE — Patient Instructions (Signed)
Insomnia Insomnia is a sleep disorder that makes it difficult to fall asleep or to stay asleep. Insomnia can cause tiredness (fatigue), low energy, difficulty concentrating, mood swings, and poor performance at work or school. There are three different ways to classify insomnia:  Difficulty falling asleep.  Difficulty staying asleep.  Waking up too early in the morning. Any type of insomnia can be long-term (chronic) or short-term (acute). Both are common. Short-term insomnia usually lasts for three months or less. Chronic insomnia occurs at least three times a week for longer than three months. What are the causes? Insomnia may be caused by another condition, situation, or substance, such as:  Anxiety.  Certain medicines.  Gastroesophageal reflux disease (GERD) or other gastrointestinal conditions.  Asthma or other breathing conditions.  Restless legs syndrome, sleep apnea, or other sleep disorders.  Chronic pain.  Menopause. This may include hot flashes.  Stroke.  Abuse of alcohol, tobacco, or illegal drugs.  Depression.  Caffeine.  Neurological disorders, such as Alzheimer disease.  An overactive thyroid (hyperthyroidism). The cause of insomnia may not be known. What increases the risk? Risk factors for insomnia include:  Gender. Women are more commonly affected than men.  Age. Insomnia is more common as you get older.  Stress. This may involve your professional or personal life.  Income. Insomnia is more common in people with lower income.  Lack of exercise.  Irregular work schedule or night shifts.  Traveling between different time zones. What are the signs or symptoms? If you have insomnia, trouble falling asleep or trouble staying asleep is the main symptom. This may lead to other symptoms, such as:  Feeling fatigued.  Feeling nervous about going to sleep.  Not feeling rested in the morning.  Having trouble concentrating.  Feeling irritable,  anxious, or depressed. How is this treated? Treatment for insomnia depends on the cause. If your insomnia is caused by an underlying condition, treatment will focus on addressing the condition. Treatment may also include:  Medicines to help you sleep.  Counseling or therapy.  Lifestyle adjustments. Follow these instructions at home:  Take medicines only as directed by your health care provider.  Keep regular sleeping and waking hours. Avoid naps.  Keep a sleep diary to help you and your health care provider figure out what could be causing your insomnia. Include:  When you sleep.  When you wake up during the night.  How well you sleep.  How rested you feel the next day.  Any side effects of medicines you are taking.  What you eat and drink.  Make your bedroom a comfortable place where it is easy to fall asleep:  Put up shades or special blackout curtains to block light from outside.  Use a white noise machine to block noise.  Keep the temperature cool.  Exercise regularly as directed by your health care provider. Avoid exercising right before bedtime.  Use relaxation techniques to manage stress. Ask your health care provider to suggest some techniques that may work well for you. These may include:  Breathing exercises.  Routines to release muscle tension.  Visualizing peaceful scenes.  Cut back on alcohol, caffeinated beverages, and cigarettes, especially close to bedtime. These can disrupt your sleep.  Do not overeat or eat spicy foods right before bedtime. This can lead to digestive discomfort that can make it hard for you to sleep.  Limit screen use before bedtime. This includes:  Watching TV.  Using your smartphone, tablet, and computer.  Stick to a   routine. This can help you fall asleep faster. Try to do a quiet activity, brush your teeth, and go to bed at the same time each night.  Get out of bed if you are still awake after 15 minutes of trying to  sleep. Keep the lights down, but try reading or doing a quiet activity. When you feel sleepy, go back to bed.  Make sure that you drive carefully. Avoid driving if you feel very sleepy.  Keep all follow-up appointments as directed by your health care provider. This is important. Contact a health care provider if:  You are tired throughout the day or have trouble in your daily routine due to sleepiness.  You continue to have sleep problems or your sleep problems get worse. Get help right away if:  You have serious thoughts about hurting yourself or someone else. This information is not intended to replace advice given to you by your health care provider. Make sure you discuss any questions you have with your health care provider. Document Released: 09/16/2000 Document Revised: 02/19/2016 Document Reviewed: 06/20/2014 Elsevier Interactive Patient Education  2017 Elsevier Inc.  

## 2016-11-24 ENCOUNTER — Telehealth: Payer: Self-pay

## 2016-11-24 LAB — COMPREHENSIVE METABOLIC PANEL
A/G RATIO: 1.7 (ref 1.2–2.2)
ALT: 15 IU/L (ref 0–32)
AST: 15 IU/L (ref 0–40)
Albumin: 4.3 g/dL (ref 3.5–5.5)
Alkaline Phosphatase: 93 IU/L (ref 39–117)
BILIRUBIN TOTAL: 0.3 mg/dL (ref 0.0–1.2)
BUN/Creatinine Ratio: 15 (ref 9–23)
BUN: 8 mg/dL (ref 6–24)
CHLORIDE: 101 mmol/L (ref 96–106)
CO2: 25 mmol/L (ref 18–29)
Calcium: 9.5 mg/dL (ref 8.7–10.2)
Creatinine, Ser: 0.55 mg/dL — ABNORMAL LOW (ref 0.57–1.00)
GFR calc Af Amer: 126 (ref >59)
GFR calc non Af Amer: 109 (ref >59)
GLOBULIN, TOTAL: 2.6 (ref 1.5–4.5)
Glucose: 100 mg/dL — ABNORMAL HIGH (ref 65–99)
POTASSIUM: 4.5 mmol/L (ref 3.5–5.2)
SODIUM: 142 mmol/L (ref 134–144)
Total Protein: 6.9 g/dL (ref 6.0–8.5)

## 2016-11-24 LAB — CBC WITH DIFFERENTIAL/PLATELET
Basophils Absolute: 0.1 10*3/uL (ref 0.0–0.2)
Basos: 0 %
EOS (ABSOLUTE): 0.5 10*3/uL — ABNORMAL HIGH (ref 0.0–0.4)
Eos: 4 %
Hematocrit: 41.7 % (ref 34.0–46.6)
Hemoglobin: 14.1 g/dL (ref 11.1–15.9)
Immature Grans (Abs): 0.1 10*3/uL (ref 0.0–0.1)
Immature Granulocytes: 1 %
LYMPHS ABS: 1.8 10*3/uL (ref 0.7–3.1)
Lymphs: 15 %
MCH: 35.7 pg — AB (ref 26.6–33.0)
MCHC: 33.8 g/dL (ref 31.5–35.7)
MCV: 106 fL — AB (ref 79–97)
MONOS ABS: 0.8 10*3/uL (ref 0.1–0.9)
Monocytes: 7 %
NEUTROS ABS: 8.6 10*3/uL — AB (ref 1.4–7.0)
NEUTROS PCT: 73 %
PLATELETS: 307 10*3/uL (ref 150–379)
RBC: 3.95 x10E6/uL (ref 3.77–5.28)
RDW: 12.6 % (ref 12.3–15.4)
WBC: 11.8 10*3/uL — ABNORMAL HIGH (ref 3.4–10.8)

## 2016-11-24 LAB — LIPID PANEL
CHOLESTEROL TOTAL: 182 mg/dL (ref 100–199)
Chol/HDL Ratio: 3.5 (ref 0.0–4.4)
HDL: 52 mg/dL (ref >39)
LDL Calculated: 96 (ref 0–99)
Triglycerides: 172 mg/dL — ABNORMAL HIGH (ref 0–149)
VLDL Cholesterol Cal: 34 (ref 5–40)

## 2016-11-24 LAB — TSH: TSH: 2.55 u[IU]/mL (ref 0.450–4.500)

## 2016-11-24 LAB — HEMOGLOBIN A1C
ESTIMATED AVERAGE GLUCOSE: 97
Hgb A1c MFr Bld: 5 % (ref 4.8–5.6)

## 2016-11-24 NOTE — Telephone Encounter (Signed)
-----   Message from Mar Daring, [place] sent at 11/24/2016 10:18 AM EST ----- All labs are WNL and stable with exception of slight elevation in WBC count. This could be a viral URI or viral infection of some sort. I do recommend [place] rechecking your CBC in 2-3 weeks to see if any improvement.

## 2016-11-24 NOTE — Telephone Encounter (Signed)
Patient advised as directed below. Patient is going to call in 2-3 weeks for the lab requisition form CBC only.  Thanks,  -Aleda Madl

## 2016-12-12 DIAGNOSIS — F17210 Nicotine dependence, cigarettes, uncomplicated: Secondary | ICD-10-CM | POA: Diagnosis not present

## 2016-12-12 DIAGNOSIS — R21 Rash and other nonspecific skin eruption: Secondary | ICD-10-CM | POA: Insufficient documentation

## 2016-12-12 NOTE — ED Triage Notes (Addendum)
Pt with itching and rash to trunk since last Thursday, has used otc meds without relief. Pt unsure of cause, states might be stress.

## 2016-12-13 ENCOUNTER — Emergency Department
Admission: EM | Admit: 2016-12-13 | Discharge: 2016-12-13 | Disposition: A | Discharge status: Home / Self Care | Payer: 59 | Attending: Emergency Medicine | Admitting: Emergency Medicine

## 2016-12-13 DIAGNOSIS — R21 Rash and other nonspecific skin eruption: Secondary | ICD-10-CM

## 2016-12-13 MED ORDER — PREDNISONE 20 MG PO TABS: 60.0000 mg | ORAL_TABLET | Freq: Once | ORAL | Status: DC

## 2016-12-13 MED ORDER — DIPHENHYDRAMINE HCL 25 MG PO CAPS: 25.0000 mg | ORAL_CAPSULE | Freq: Once | ORAL | Status: DC

## 2016-12-13 NOTE — ED Notes (Signed)
Pt verbalized she did not want BP checked prior to being discharged. Pt states she is ready to get home.

## 2016-12-13 NOTE — Discharge Instructions (Signed)
You did not want any treatment at this time for rash. Please follow-up with your oncologist as he had discussed in the morning.

## 2016-12-13 NOTE — ED Provider Notes (Signed)
[facility] Emergency Department Provider Note   ____________________________________________   First MD Initiated Contact with Patient 12/13/16 0025     (approximate)  I have reviewed the triage vital signs and the nursing notes.   HISTORY  Chief Complaint Urticaria    HPI Abigail Reeves is a 51 y.o. female who comes into the hospital today with some itching to her bilateral flanks. The patient reports that she's broken out and itching since last Thursday. She hasn't taken anything for the rash nor has she put anything on it. She reports that she uses suave. She states that she tried to put it off until she could see her oncologist tomorrow but she was itching very badly tonight. The patient denies any new foods, medications, soaps or detergents. She's never had anything like this in the past. She is going to [facility] oncology tomorrow. She reports that she's had a lot of stress recently and is unsure if it's related to the stress.   Past Medical History:  Diagnosis Date  . Cancer [facility])    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, stage 2    Patient Active Problem List   Diagnosis Date Noted  . Fatigue 11/23/2016  . Primary insomnia 11/23/2016  . Hearing loss of left ear 11/23/2016  . Tobacco use disorder, continuous 11/23/2016  . Alcohol abuse 11/23/2016  . Breast cyst 12/25/2015  . Malignant neoplasm of lower-inner quadrant of female breast ([place]) 12/18/2015    Past Surgical History:  Procedure Laterality Date  . ABDOMINAL HYSTERECTOMY    . AUGMENTATION MAMMAPLASTY Bilateral 2012  . 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 ELBOW SURGERY Left     Prior to Admission medications   Medication Sig Start Date End Date Taking? Authorizing Provider  clonazePAM (KLONOPIN) 0.5 MG tablet Take 1 tablet (0.5 mg total) by mouth at bedtime. 11/23/16   Mar Daring, PA-C    Allergies Patient has no known allergies.  Family  History  Problem Relation Age of Onset  . Breast cancer Mother     63  . Pancreatic cancer Mother     52  . Breast cancer Maternal Aunt     63  . Breast cancer Maternal Grandmother     ?  . Diabetes Father     Social History Social History  Substance Use Topics  . Smoking status: Current Every Day Smoker    Packs/day: 1.00    Years: 25.00    Types: Cigarettes  . Smokeless tobacco: Never Used  . Alcohol use 12.0 oz/week    20 Cans of beer per week    Review of Systems Constitutional: No fever/chills Eyes: No visual changes. ENT: No sore throat. Cardiovascular: Denies chest pain. Respiratory: Denies shortness of breath. Gastrointestinal: No abdominal pain.  No nausea, no vomiting.  No diarrhea.  No constipation. Genitourinary: Negative for dysuria. Musculoskeletal: Negative for back pain. Skin:  rash. Neurological: Negative for headaches, focal weakness or numbness.  10-point ROS otherwise negative.  ____________________________________________   PHYSICAL EXAM:  VITAL SIGNS: ED Triage Vitals  Enc Vitals Group     BP 12/12/16 2327 125/76     Pulse Rate 12/12/16 2327 (!) 105     Resp 12/12/16 2327 20     Temp 12/12/16 2327 97.9 F (36.6 C)     Temp Source 12/12/16 2327 Oral     SpO2 12/12/16 2327 100 %     Weight 12/12/16 2328 153 lb (  69.4 kg)     Height 12/12/16 2328 5\' 6"  (1.676 m)     Head Circumference --      Peak Flow --      Pain Score --      Pain Loc --      Pain Edu? --      Excl. in [place]? --     Constitutional: Alert and oriented. Well appearing and in mild distress. Eyes: Conjunctivae are normal. PERRL. EOMI. Head: Atraumatic. Nose: No congestion/rhinnorhea. Mouth/Throat: Mucous membranes are moist.  Oropharynx non-erythematous. Cardiovascular: Normal rate, regular rhythm. Grossly normal heart sounds.  Good peripheral circulation. Respiratory: Normal respiratory effort.  No retractions. Lungs CTAB. Gastrointestinal: Soft and nontender. No  distention.  Musculoskeletal: No lower extremity tenderness nor edema.  Positive bowel sounds. Neurologic:  Normal speech and language.  Skin:  Skin is warm, dry and intact. Erythema to the patient's left flank with some dry skin to the patient's flank, mid back and lower abdomen. Minimal maculopapular rash to the left flank as well with no pustules, no hives or urticaria. Psychiatric: Mood and affect are normal.   ____________________________________________   LABS (all labs ordered are listed, but only abnormal results are displayed)  Labs Reviewed - No data to display ____________________________________________  EKG  none ____________________________________________  RADIOLOGY  none ____________________________________________   PROCEDURES  Procedure(s) performed: None  Procedures  Critical Care performed: No  ____________________________________________   INITIAL IMPRESSION / ASSESSMENT AND PLAN / ED COURSE  Pertinent labs & imaging results that were available during my care of the patient were reviewed by me and considered in my medical decision making (see chart for details).  This is a 51 year old female who comes into the hospital today with itching and some rash to her flank and back. The patient reports that she just could not sleep and couldn't deal with it. I will give the patient some Benadryl as well as some prednisone. She will be reassessed when she's received her medication.     The nurse came back to me and reports that the patient does not want to take the medication if she does not have to. She states that she's been on multiple medications and wants to make sure she does not have shingles. She reports that she is also seeing her oncologist in the morning. I informed the patient that I was simply trying to treat her symptoms which is what she reports brought her into the hospital. At this time I am unsure if this is a reaction and was going to use the  medication to see if that would help improve some of the redness. The patient reports that she does not want to take any other medication at this time. I informed the patient at that time that she can follow-up with her doctor. The patient reports that she is ready to go home and does not want to take any other medication. She'll be discharged to home. ____________________________________________   FINAL CLINICAL IMPRESSION(S) / ED DIAGNOSES  Final diagnoses:  Rash      NEW MEDICATIONS STARTED DURING THIS VISIT:  Discharge Medication List as of 12/13/2016  1:15 AM       Note:  This document was prepared using Dragon voice recognition software and may include unintentional dictation errors.    Loney Hering, MD 12/13/16 [id number]

## 2016-12-13 NOTE — ED Notes (Signed)
Pt refusing medications at this time.  EDP notified.

## 2016-12-22 ENCOUNTER — Ambulatory Visit (INDEPENDENT_AMBULATORY_CARE_PROVIDER_SITE_OTHER): Payer: Managed Care, Other (non HMO) | Admitting: Physician Assistant

## 2016-12-22 ENCOUNTER — Encounter: Payer: Self-pay | Admitting: Physician Assistant

## 2016-12-22 VITALS — BP 120/70 | HR 103 | Temp 98.7°F | Resp 16 | Wt 159.2 lb

## 2016-12-22 DIAGNOSIS — J0110 Acute frontal sinusitis, unspecified: Secondary | ICD-10-CM | POA: Diagnosis not present

## 2016-12-22 MED ORDER — AMOXICILLIN-POT CLAVULANATE 875-125 MG PO TABS: 1.0000 | ORAL_TABLET | Freq: Two times a day (BID) | ORAL | 0 refills | Status: AC

## 2016-12-22 NOTE — Progress Notes (Signed)
Patient: Abigail Reeves Female    DOB: 08-26-1966   51 y.o.   MRN: [id number] Visit Date: 12/22/2016  Today's Provider: Trinna Post, PA-C   Chief Complaint  Patient presents with  . URI   Subjective:    URI   This is a new problem. The current episode started in the past 7 days. The problem has been gradually worsening. There has been no fever. Associated symptoms include congestion, coughing, ear pain (shooting pain sometimes), headaches, a plugged ear sensation, rhinorrhea, sinus pain, sneezing and a sore throat. Pertinent negatives include no abdominal pain, chest pain, diarrhea, nausea, vomiting or wheezing. She has tried decongestant, increased fluids and acetaminophen (Dayquil, alka seltzer plus, mucinex DM) for the symptoms. The treatment provided no relief.       No Known Allergies   Current Outpatient Prescriptions:  .  clonazePAM (KLONOPIN) 0.5 MG tablet, Take 1 tablet (0.5 mg total) by mouth at bedtime., Disp: 30 tablet, Rfl: 1  Review of Systems  Constitutional: Positive for appetite change (not hungry) and chills.  HENT: Positive for congestion, ear pain (shooting pain sometimes), postnasal drip, rhinorrhea, sinus pain, sinus pressure, sneezing, sore throat and trouble swallowing.   Respiratory: Positive for cough. Negative for chest tightness, shortness of breath and wheezing.   Cardiovascular: Negative for chest pain, palpitations and leg swelling.  Gastrointestinal: Negative for abdominal pain, diarrhea, nausea and vomiting.  Neurological: Positive for headaches.    Social History  Substance Use Topics  . Smoking status: Current Every Day Smoker    Packs/day: 1.00    Years: 25.00    Types: Cigarettes  . Smokeless tobacco: Never Used  . Alcohol use 12.0 oz/week    20 Cans of beer per week   Objective:   BP 120/70 (BP Location: Left Arm, Patient Position: Sitting, Cuff Size: Normal)   Pulse (!) 103   Temp 98.7 F (37.1 C) (Oral)   Resp 16    Wt 159 lb 3.2 oz (72.2 kg)   LMP 11/24/1997 (Exact Date)   SpO2 97%   BMI 25.70 kg/m  Vitals:   12/22/16 1038  BP: 120/70  Pulse: (!) 103  Resp: 16  Temp: 98.7 F (37.1 C)  TempSrc: Oral  SpO2: 97%  Weight: 159 lb 3.2 oz (72.2 kg)     Physical Exam  Constitutional: She is oriented to person, place, and time. She appears well-developed and well-nourished. No distress.  HENT:  Right Ear: External ear normal.  Left Ear: External ear normal.  Nose: Right sinus exhibits maxillary sinus tenderness and frontal sinus tenderness. Left sinus exhibits maxillary sinus tenderness and frontal sinus tenderness.  Mouth/Throat: Oropharynx is clear and moist. No oropharyngeal exudate, posterior oropharyngeal edema or posterior oropharyngeal erythema.  Tms opaque bilaterally   Eyes: Conjunctivae are normal. Right eye exhibits no discharge. Left eye exhibits no discharge.  Neck: Neck supple.  Cardiovascular: Normal rate and regular rhythm.   Pulmonary/Chest: Effort normal and breath sounds normal.  Lymphadenopathy:    She has cervical adenopathy.  Neurological: She is alert and oriented to person, place, and time.  Skin: Skin is warm and dry. She is not diaphoretic.  Psychiatric: She has a normal mood and affect. Her behavior is normal.        Assessment & Plan:     1. Acute non-recurrent frontal sinusitis  Will tx with antibiotics 2/2 severity of symptoms. Advised to stop smoking.   - amoxicillin-clavulanate (AUGMENTIN) 875-125 MG tablet;  Take 1 tablet by mouth 2 (two) times daily.  Dispense: 20 tablet; Refill: 0  The entirety of the information documented in the History of Present Illness, Review of Systems and Physical Exam were personally obtained by me. Portions of this information were initially documented by Ashley Royalty, CMA and reviewed by me for thoroughness and accuracy.   Return if symptoms worsen or fail to improve.        Trinna Post, PA-C  [facility]

## 2016-12-22 NOTE — Patient Instructions (Signed)

## 2016-12-23 ENCOUNTER — Ambulatory Visit (INDEPENDENT_AMBULATORY_CARE_PROVIDER_SITE_OTHER): Payer: Managed Care, Other (non HMO) | Admitting: Family Medicine

## 2016-12-23 ENCOUNTER — Encounter: Payer: Self-pay | Admitting: Family Medicine

## 2016-12-23 VITALS — BP 126/80 | HR 92 | Temp 98.5°F | Resp 16 | Wt 159.0 lb

## 2016-12-23 DIAGNOSIS — H66001 Acute suppurative otitis media without spontaneous rupture of ear drum, right ear: Secondary | ICD-10-CM

## 2016-12-23 NOTE — Progress Notes (Signed)
Patient: Abigail Reeves Female    DOB: [birth year]   51 y.o.   MRN: [id number] Visit Date: 12/23/2016  Today's Provider: Vernie Murders, PA   Chief Complaint  Patient presents with  . Ear Pain    Right ear pain   Subjective:    Otalgia   There is pain in the right ear. This is a new problem. The current episode started in the past 7 days. The problem has been gradually worsening. There has been no fever. The pain is at a severity of 8/10. Associated symptoms include coughing, ear discharge (Ear discharge this morning.), hearing loss and a sore throat. Pertinent negatives include no abdominal pain, diarrhea, headaches, neck pain, rash, rhinorrhea or vomiting.   Patient Active Problem List   Diagnosis Date Noted  . Fatigue 11/23/2016  . Primary insomnia 11/23/2016  . Hearing loss of left ear 11/23/2016  . Tobacco use disorder, continuous 11/23/2016  . Alcohol abuse 11/23/2016  . Breast cyst 12/25/2015  . 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 ABDOMINAL HYSTERECTOMY    . AUGMENTATION MAMMAPLASTY Bilateral 2012  . 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 ELBOW SURGERY Left    Family History  Problem Relation Age of Onset  . Breast cancer Mother     102  . Pancreatic cancer Mother     79  . Breast cancer Maternal Aunt     63  . Breast cancer Maternal Grandmother     ?  . Diabetes Father     No Known Allergies  Current Outpatient Prescriptions:  .  amoxicillin-clavulanate (AUGMENTIN) 875-125 MG tablet, Take 1 tablet by mouth 2 (two) times daily., Disp: 20 tablet, Rfl: 0 .  clonazePAM (KLONOPIN) 0.5 MG tablet, Take 1 tablet (0.5 mg total) by mouth at bedtime., Disp: 30 tablet, Rfl: 1  Review of Systems  Constitutional: Positive for fatigue. Negative for activity change, appetite change, chills, diaphoresis, fever and unexpected  weight change.  HENT: Positive for ear discharge (Ear discharge this morning.), ear pain, hearing loss, postnasal drip, sinus pain, sinus pressure, sore throat, tinnitus and trouble swallowing. Negative for congestion, nosebleeds, rhinorrhea and sneezing.   Eyes: Negative.   Respiratory: Positive for cough. Negative for apnea, choking, chest tightness, shortness of breath, wheezing and stridor.   Gastrointestinal: Negative.  Negative for abdominal pain, diarrhea and vomiting.  Musculoskeletal: Negative for neck pain.  Skin: Negative for rash.  Neurological: Positive for light-headedness. Negative for dizziness and headaches.    Social History  Substance Use Topics  . Smoking status: Current Every Day Smoker    Packs/day: 1.00    Years: 25.00    Types: Cigarettes  . Smokeless tobacco: Never Used  . Alcohol use 12.0 oz/week    20 Cans of beer per week   Objective:   BP 126/80 (BP Location: Left Arm, Patient Position: Sitting, Cuff Size: Normal)   Pulse 92   Temp 98.5 F (36.9 C) (Oral)   Resp 16   Wt 159 lb (72.1 kg)   LMP 11/24/1997 (Exact Date)   BMI 25.66 kg/m  Vitals:   12/23/16 1525  BP: 126/80  Pulse: 92  Resp: 16  Temp: 98.5 F (36.9 C)  TempSrc: Oral  Weight: 159 lb (72.1 kg)     Physical Exam  Constitutional: She is oriented to person, place, and time.  She appears well-developed and well-nourished. No distress.  HENT:  Head: Normocephalic and atraumatic.  Right Ear: Hearing normal.  Left Ear: Hearing and external ear normal.  Nose: Nose normal.  Diminished transillumination of sinuses but not tenderness. Right TM with extreme bulge and erythema. Pain increased to move auricle or tragus. No drainage from either ear canal. Hearing diminished on the right.  Eyes: Conjunctivae and lids are normal. Right eye exhibits no discharge. Left eye exhibits no discharge. No scleral icterus.  Neck: Neck supple.  Cardiovascular: Normal rate and regular rhythm.     Pulmonary/Chest: Effort normal. No respiratory distress.  Abdominal: Soft.  Musculoskeletal: Normal range of motion.  Lymphadenopathy:    She has no cervical adenopathy.  Neurological: She is alert and oriented to person, place, and time.  Skin: Skin is intact. No lesion and no rash noted.  Psychiatric: She has a normal mood and affect. Her speech is normal and behavior is normal. Thought content normal.      Assessment & Plan:     1. Acute suppurative otitis media of right ear without spontaneous rupture of tympanic membrane, recurrence not specified Onset with cough sore throat and sneezing over the past week. Pain in the right ear is worse but no drainage or signs of ruptured TM. No fever today. Started Augmentin yesterday for frontal sinusitis. Recommend starting Flonase nasal spray at bedtime and use Auralgan ear drops for pain prn. May add Afrin BID for 2-3 days to decongest eustachian tubes and use Tylenol or Advil prn pain. May need ENT referral for myringotomy if no better in 3-4 days. Continue the Augmentin 875-125 mg BID.       Vernie Murders, PA  [facility]

## 2017-01-11 ENCOUNTER — Encounter: Payer: Self-pay | Admitting: Physician Assistant

## 2017-01-11 NOTE — Telephone Encounter (Signed)
Abigail Reeves,  Can you put [name] in my 7 tomorrow for 30 min  JB

## 2017-01-12 ENCOUNTER — Ambulatory Visit: Payer: Self-pay | Admitting: Physician Assistant

## 2017-01-13 ENCOUNTER — Telehealth: Payer: Self-pay

## 2017-01-13 ENCOUNTER — Ambulatory Visit (INDEPENDENT_AMBULATORY_CARE_PROVIDER_SITE_OTHER): Payer: Managed Care, Other (non HMO) | Admitting: Physician Assistant

## 2017-01-13 ENCOUNTER — Encounter: Payer: Self-pay | Admitting: Physician Assistant

## 2017-01-13 VITALS — BP 126/80 | HR 108 | Temp 98.2°F | Resp 16 | Ht 66.0 in | Wt 154.6 lb

## 2017-01-13 DIAGNOSIS — R05 Cough: Secondary | ICD-10-CM

## 2017-01-13 DIAGNOSIS — R059 Cough, unspecified: Secondary | ICD-10-CM

## 2017-01-13 DIAGNOSIS — F5101 Primary insomnia: Secondary | ICD-10-CM | POA: Diagnosis not present

## 2017-01-13 DIAGNOSIS — F418 Other specified anxiety disorders: Secondary | ICD-10-CM

## 2017-01-13 DIAGNOSIS — J069 Acute upper respiratory infection, unspecified: Secondary | ICD-10-CM | POA: Diagnosis not present

## 2017-01-13 MED ORDER — PSEUDOEPH-BROMPHEN-DM 30-2-10 MG/5ML PO SYRP: 5.0000 mL | ORAL_SOLUTION | Freq: Four times a day (QID) | ORAL | 0 refills | Status: DC | PRN

## 2017-01-13 MED ORDER — CLONAZEPAM 0.5 MG PO TABS: 0.5000 mg | ORAL_TABLET | Freq: Every day | ORAL | 5 refills | Status: DC

## 2017-01-13 MED ORDER — AZITHROMYCIN 250 MG PO TABS: ORAL_TABLET | ORAL | 0 refills | Status: DC

## 2017-01-13 MED ORDER — CITALOPRAM HYDROBROMIDE 20 MG PO TABS: ORAL_TABLET | ORAL | 1 refills | Status: DC

## 2017-01-13 NOTE — Telephone Encounter (Signed)
Prescription for Klonopin 05 mg called in to [facility] pharmacy. Spoke with Elmyra Ricks.  Thanks,  -Jamonica Schoff

## 2017-01-13 NOTE — Patient Instructions (Signed)
Citalopram tablets What is this medicine? CITALOPRAM (sye TAL oh pram) is a medicine for depression. This medicine may be used for other purposes; ask your health care provider or pharmacist if you have questions. COMMON BRAND NAME(S): Celexa What should I tell my health care provider before I take this medicine? They need to know if you have any of these conditions: -bleeding disorders -bipolar disorder or a family history of bipolar disorder -glaucoma -heart disease -history of irregular heartbeat -kidney disease -liver disease -low levels of magnesium or potassium in the blood -receiving electroconvulsive therapy -seizures -suicidal thoughts, plans, or attempt; a previous suicide attempt by you or a family member -take medicines that treat or prevent blood clots -thyroid disease -an unusual or allergic reaction to citalopram, escitalopram, other medicines, foods, dyes, or preservatives -pregnant or trying to become pregnant -breast-feeding How should I use this medicine? Take this medicine by mouth with a glass of water. Follow the directions on the prescription label. You can take it with or without food. Take your medicine at regular intervals. Do not take your medicine more often than directed. Do not stop taking this medicine suddenly except upon the advice of your doctor. Stopping this medicine too quickly may cause serious side effects or your condition may worsen. A special MedGuide will be given to you by the pharmacist with each prescription and refill. Be sure to read this information carefully each time. Talk to your pediatrician regarding the use of this medicine in children. Special care may be needed. Patients over 52 years old may have a stronger reaction and need a smaller dose. Overdosage: If you think you have taken too much of this medicine contact a poison control center or emergency room at once. NOTE: This medicine is only for you. Do not share this medicine with  others. What if I miss a dose? If you miss a dose, take it as soon as you can. If it is almost time for your next dose, take only that dose. Do not take double or extra doses. What may interact with this medicine? Do not take this medicine with any of the following medications: -certain medicines for fungal infections like fluconazole, itraconazole, ketoconazole, posaconazole, voriconazole -cisapride -dofetilide -dronedarone -escitalopram -linezolid -MAOIs like Carbex, Eldepryl, Marplan, Nardil, and Parnate -methylene blue (injected into a vein) -pimozide -thioridazine -ziprasidone This medicine may also interact with the following medications: -alcohol -amphetamines -aspirin and aspirin-like medicines -carbamazepine -certain medicines for depression, anxiety, or psychotic disturbances -certain medicines for infections like chloroquine, clarithromycin, erythromycin, furazolidone, isoniazid, pentamidine -certain medicines for migraine headaches like almotriptan, eletriptan, frovatriptan, naratriptan, rizatriptan, sumatriptan, zolmitriptan -certain medicines for sleep -certain medicines that treat or prevent blood clots like dalteparin, enoxaparin, warfarin -cimetidine -diuretics -fentanyl -lithium -methadone -metoprolol -NSAIDs, medicines for pain and inflammation, like ibuprofen or naproxen -omeprazole -other medicines that prolong the QT interval (cause an abnormal heart rhythm) -procarbazine -rasagiline -supplements like St. John's wort, kava kava, valerian -tramadol -tryptophan This list may not describe all possible interactions. Give your health care provider a list of all the medicines, herbs, non-prescription drugs, or dietary supplements you use. Also tell them if you smoke, drink alcohol, or use illegal drugs. Some items may interact with your medicine. What should I watch for while using this medicine? Tell your doctor if your symptoms do not get better or if they  get worse. Visit your doctor or health care professional for regular checks on your progress. Because it may take several weeks to see the full   effects of this medicine, it is important to continue your treatment as prescribed by your doctor. Patients and their families should watch out for new or worsening thoughts of suicide or depression. Also watch out for sudden changes in feelings such as feeling anxious, agitated, panicky, irritable, hostile, aggressive, impulsive, severely restless, overly excited and hyperactive, or not being able to sleep. If this happens, especially at the beginning of treatment or after a change in dose, call your health care professional. You may get drowsy or dizzy. Do not drive, use machinery, or do anything that needs mental alertness until you know how this medicine affects you. Do not stand or sit up quickly, especially if you are an older patient. This reduces the risk of dizzy or fainting spells. Alcohol may interfere with the effect of this medicine. Avoid alcoholic drinks. Your mouth may get dry. Chewing sugarless gum or sucking hard candy, and drinking plenty of water will help. Contact your doctor if the problem does not go away or is severe. What side effects may I notice from receiving this medicine? Side effects that you should report to your doctor or health care professional as soon as possible: -allergic reactions like skin rash, itching or hives, swelling of the face, lips, or tongue -anxious -black, tarry stools -breathing problems -changes in vision -chest pain -confusion -elevated mood, decreased need for sleep, racing thoughts, impulsive behavior -eye pain -fast, irregular heartbeat -feeling faint or lightheaded, falls -feeling agitated, angry, or irritable -hallucination, loss of contact with reality -loss of balance or coordination -loss of memory -painful or prolonged erections -restlessness, pacing, inability to keep  still -seizures -stiff muscles -suicidal thoughts or other mood changes -trouble sleeping -unusual bleeding or bruising -unusually weak or tired -vomiting Side effects that usually do not require medical attention (report to your doctor or health care professional if they continue or are bothersome): -change in appetite or weight -change in sex drive or performance -dizziness -headache -increased sweating -indigestion, nausea -tremors This list may not describe all possible side effects. Call your doctor for medical advice about side effects. You may report side effects to FDA at 1-800-FDA-1088. Where should I keep my medicine? Keep out of reach of children. Store at room temperature between 15 and 30 degrees C (59 and 86 degrees F). Throw away any unused medicine after the expiration date. NOTE: This sheet is a summary. It may not cover all possible information. If you have questions about this medicine, talk to your doctor, pharmacist, or health care provider.  2018 Elsevier/Gold Standard (2016-02-22 13:18:52)  

## 2017-01-13 NOTE — Progress Notes (Signed)
Patient: Abigail Reeves Female    DOB: [birth year]   51 y.o.   MRN: [id number] Visit Date: 01/13/2017  Today's Provider: Mar Daring, PA-C   Chief Complaint  Patient presents with  . Depression   Subjective:    HPI  Depression, Follow-up  She  was last seen for this 3 weeks ago. Changes made at last visit include no chagnes.   She reports excellent compliance with treatment. She is not having side effects.   She reports excellent tolerance of treatment. Current symptoms include: difficulty concentrating and impaired memory She feels she is Worse since last visit. Patient is having problems with her children. Her daughter has an issue with substance abuse. She is currently living with the patient and her daughter lives with them as well. Of recent, however, her son got into trouble with the law in [place], [place]. He had to come back to [place] and is going to have many court dates and possible jail time. She did not go much more in to detail about the issue, just stating it was something to do with money and it isn't good.  ------------------------------------------------------------------------  Cough: Patient complains of productive cough with sputum described as green.  Symptoms began 2 weeks ago.  The cough is without wheezing, dyspnea or hemoptysis and is aggravated by nothing Associated symptoms include:sputum production. Patient does not have new pets. Patient does not have a history of asthma. Patient does not have a history of environmental allergens. Patient has not recent travel. Patient does have a history of smoking. Patient  does not have previous Chest X-ray. Patient does not have had a PPD done.     No Known Allergies   Current Outpatient Prescriptions:  .  clonazePAM (KLONOPIN) 0.5 MG tablet, Take 1 tablet (0.5 mg total) by mouth at bedtime., Disp: 30 tablet, Rfl: 1  Review of Systems  Constitutional: Positive for fatigue. Negative for chills  and fever.  HENT: Positive for congestion, postnasal drip, rhinorrhea, sinus pain and sinus pressure. Negative for ear pain, sore throat and tinnitus.   Eyes: Positive for discharge and redness.  Respiratory: Positive for cough. Negative for chest tightness, shortness of breath and wheezing.   Cardiovascular: Negative for chest pain, palpitations and leg swelling.  Gastrointestinal: Negative for abdominal pain, nausea and vomiting.  Neurological: Positive for headaches. Negative for dizziness.  Psychiatric/Behavioral: Positive for agitation, behavioral problems, confusion, decreased concentration and sleep disturbance. Negative for self-injury and suicidal ideas. The patient is nervous/anxious.     Social History  Substance Use Topics  . Smoking status: Current Every Day Smoker    Packs/day: 1.00    Years: 25.00    Types: Cigarettes  . Smokeless tobacco: Never Used  . Alcohol use 12.0 oz/week    20 Cans of beer per week   Objective:   BP 126/80 (BP Location: Left Arm, Patient Position: Sitting, Cuff Size: Large)   Pulse (!) 108   Temp 98.2 F (36.8 C) (Oral)   Resp 16   Ht 5\' 6"  (1.676 m)   Wt 154 lb 9.6 oz (70.1 kg)   LMP 11/24/1997 (Exact Date)   SpO2 96%   BMI 24.95 kg/m  Vitals:   01/13/17 1441  BP: 126/80  Pulse: (!) 108  Resp: 16  Temp: 98.2 F (36.8 C)  TempSrc: Oral  SpO2: 96%  Weight: 154 lb 9.6 oz (70.1 kg)  Height: 5\' 6"  (1.676 m)   Depression screen [facility] 2/9 01/13/2017  Decreased Interest 1  Down, Depressed, Hopeless 1  PHQ - 2 Score 2  Altered sleeping 3  Change in appetite 3  Feeling bad or failure about yourself  0  Trouble concentrating 0  Moving slowly or fidgety/restless 0  Suicidal thoughts 0  PHQ-9 Score 8  Difficult doing work/chores Somewhat difficult   GAD 7 : Generalized Anxiety Score 01/13/2017  Nervous, Anxious, on Edge 1  Control/stop worrying 1  Worry too much - different things 3  Trouble relaxing 2  Restless 3  Easily annoyed  or irritable 0  Afraid - awful might happen 2  Total GAD 7 Score 12  Anxiety Difficulty Somewhat difficult    Physical Exam  Constitutional: She appears well-developed and well-nourished. No distress.  HENT:  Head: Normocephalic and atraumatic.  Right Ear: Hearing, tympanic membrane, external ear and ear canal normal.  Left Ear: Hearing, tympanic membrane, external ear and ear canal normal.  Nose: Mucosal edema and rhinorrhea present. Right sinus exhibits no maxillary sinus tenderness and no frontal sinus tenderness. Left sinus exhibits no maxillary sinus tenderness and no frontal sinus tenderness.  Mouth/Throat: Uvula is midline, oropharynx is clear and moist and mucous membranes are normal. No oropharyngeal exudate, posterior oropharyngeal edema or posterior oropharyngeal erythema.  Neck: Normal range of motion. Neck supple. No JVD present. No tracheal deviation present. No thyromegaly present.  Cardiovascular: Normal rate, regular rhythm and normal heart sounds.  Exam reveals no gallop and no friction rub.   No murmur heard. Pulmonary/Chest: Effort normal and breath sounds normal. No respiratory distress. She has no wheezes. She has no rales.  Lymphadenopathy:    She has no cervical adenopathy.  Skin: She is not diaphoretic.  Psychiatric: Her speech is normal and behavior is normal. Judgment and thought content normal. Her mood appears anxious. Cognition and memory are normal. She exhibits a depressed mood. She expresses no suicidal plans and no homicidal plans.  Vitals reviewed.     Assessment & Plan:     1. Situational anxiety Will add celexa as below to clonazepam she is already taking to help sleep. I will see her back in 4 weeks to recheck how she is doing. She is to call if she has any adverse reaction to the medication or worsening symptoms.  - citalopram (CELEXA) 20 MG tablet; Take 1 tab PO daily x 1 week then may increase by 0.5 tab weekly until taking 2 tabs PO daily if  needed  Dispense: 60 tablet; Refill: 1  2. Primary insomnia Stable. Diagnosis pulled for medication refill. Continue current medical treatment plan. - clonazePAM (KLONOPIN) 0.5 MG tablet; Take 1 tablet (0.5 mg total) by mouth at bedtime.  Dispense: 30 tablet; Refill: 5  3. Upper respiratory tract infection, unspecified type Worsening symptoms that have not responded to OTC medications. Will give Zpak as below. Continue allergy medications. Stay well hydrated and get plenty of rest. Call if no symptom improvement or if symptoms worsen. - azithromycin (ZITHROMAX) 250 MG tablet; Take 2 tablets PO on day one, and one tablet PO daily thereafter until completed.  Dispense: 6 tablet; Refill: 0  4. Cough Worsening symptoms that has not responded to OTC medications. Will give Bromfedcough syrup as below for cough. Push fluids. - brompheniramine-pseudoephedrine-DM 30-2-10 MG/5ML syrup; Take 5 mLs by mouth 4 (four) times daily as needed.  Dispense: 120 mL; Refill: 0       Mar Daring, PA-C  [facility]

## 2017-02-04 ENCOUNTER — Other Ambulatory Visit: Payer: Self-pay | Admitting: Physician Assistant

## 2017-02-04 DIAGNOSIS — F418 Other specified anxiety disorders: Secondary | ICD-10-CM

## 2017-02-23 ENCOUNTER — Encounter: Payer: Self-pay | Admitting: Physician Assistant

## 2017-03-01 ENCOUNTER — Ambulatory Visit (INDEPENDENT_AMBULATORY_CARE_PROVIDER_SITE_OTHER): Payer: Managed Care, Other (non HMO) | Admitting: Physician Assistant

## 2017-03-01 ENCOUNTER — Encounter: Payer: Self-pay | Admitting: Physician Assistant

## 2017-03-01 VITALS — BP 120/82 | HR 104 | Temp 98.3°F | Resp 16 | Wt 147.0 lb

## 2017-03-01 DIAGNOSIS — F5101 Primary insomnia: Secondary | ICD-10-CM | POA: Diagnosis not present

## 2017-03-01 DIAGNOSIS — Z23 Encounter for immunization: Secondary | ICD-10-CM | POA: Diagnosis not present

## 2017-03-01 DIAGNOSIS — F411 Generalized anxiety disorder: Secondary | ICD-10-CM | POA: Diagnosis not present

## 2017-03-01 DIAGNOSIS — N6482 Hypoplasia of breast: Secondary | ICD-10-CM | POA: Insufficient documentation

## 2017-03-01 MED ORDER — CLONAZEPAM 0.5 MG PO TABS: 0.5000 mg | ORAL_TABLET | Freq: Every day | ORAL | 5 refills | Status: DC

## 2017-03-01 NOTE — Progress Notes (Signed)
Patient: Abigail Reeves Female    DOB: 08/29/1966   51 y.o.   MRN: [id number] Visit Date: 03/01/2017  Today's Provider: Mar Daring, PA-C   Chief Complaint  Patient presents with  . Anxiety   Subjective:    HPI  Anxiety, Follow-up  She  was last seen for this 3 months ago. Changes made at last visit include add celexa to clonazepam.   She reports good compliance with treatment. Patient reports that she did not start celexa. She is not having side effects.   She reports excellent tolerance of treatment. Current symptoms include: fatigue and insomnia She feels she is Improved since last visit. ------------------------------------------------------------------------    No Known Allergies   Current Outpatient Prescriptions:  .  clonazePAM (KLONOPIN) 0.5 MG tablet, Take 1 tablet (0.5 mg total) by mouth at bedtime., Disp: 30 tablet, Rfl: 5 .  citalopram (CELEXA) 20 MG tablet, Take 2 tablets (40 mg total) by mouth daily. (Patient not taking: Reported on 03/01/2017), Disp: 60 tablet, Rfl: 5  Review of Systems  Constitutional: Negative.   Respiratory: Negative.   Cardiovascular: Negative.   Neurological: Negative.   Psychiatric/Behavioral: Positive for sleep disturbance. The patient is nervous/anxious.     Social History  Substance Use Topics  . Smoking status: Current Every Day Smoker    Packs/day: 1.00    Years: 25.00    Types: Cigarettes  . Smokeless tobacco: Never Used  . Alcohol use 12.0 oz/week    20 Cans of beer per week   Objective:   BP 120/82 (BP Location: Left Arm, Patient Position: Sitting, Cuff Size: Normal)   Pulse (!) 104   Temp 98.3 F (36.8 C) (Oral)   Resp 16   Wt 147 lb (66.7 kg)   LMP 11/24/1997 (Exact Date)   BMI 23.73 kg/m  Vitals:   03/01/17 0817  BP: 120/82  Pulse: (!) 104  Resp: 16  Temp: 98.3 F (36.8 C)  TempSrc: Oral  Weight: 147 lb (66.7 kg)     Physical Exam  Constitutional: She appears well-developed  and well-nourished. No distress.  Neck: Normal range of motion. Neck supple.  Cardiovascular: Normal rate, regular rhythm and normal heart sounds.  Exam reveals no gallop and no friction rub.   No murmur heard. Pulmonary/Chest: Effort normal and breath sounds normal. No respiratory distress. She has no wheezes. She has no rales.  Skin: She is not diaphoretic.  Psychiatric: Her behavior is normal. Judgment and thought content normal. Her mood appears anxious. She exhibits a depressed mood.  Vitals reviewed.       Assessment & Plan:     1. Primary insomnia Sleeping ok but has had increased issues at home that are out of her control. 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 Her daughter has not been seen or heard from in about 2 weeks now. Will continue current medical treatment plan. I will see her back in July for her CPE. She is to call if symptoms worsen in the meantime. - clonazePAM (KLONOPIN) 0.5 MG tablet; Take 1 tablet (0.5 mg total) by mouth at bedtime.  Dispense: 30 tablet; Refill: 5  2. GAD (generalized anxiety disorder) See above medical treatment plan.  3. Need for Tdap vaccination Tdap Vaccine given to patient without complications. Patient sat for 15 minutes after administration and was tolerated well without adverse effects.       Mar Daring,  PA-C  [facility]

## 2017-03-01 NOTE — Patient Instructions (Signed)
Clonazepam tablets What is this medicine? CLONAZEPAM (kloe NA ze pam) is a benzodiazepine. It is used to treat certain types of seizures. It is also used to treat panic disorder. This medicine may be used for other purposes; ask your health care provider or pharmacist if you have questions. COMMON BRAND NAME(S): Ceberclon, Klonopin What should I tell my health care provider before I take this medicine? They need to know if you have any of these conditions: -an alcohol or drug abuse problem -bipolar disorder, depression, psychosis or other mental health condition -glaucoma -kidney or liver disease -lung or breathing disease -myasthenia gravis -Parkinson's disease -porphyria -seizures or a history of seizures -suicidal thoughts -an unusual or allergic reaction to clonazepam, other benzodiazepines, foods, dyes, or preservatives -pregnant or trying to get pregnant -breast-feeding How should I use this medicine? Take this medicine by mouth with a glass of water. Follow the directions on the prescription label. If it upsets your stomach, take it with food or milk. Take your medicine at regular intervals. Do not take it more often than directed. Do not stop taking or change the dose except on the advice of your doctor or health care professional. A special MedGuide will be given to you by the pharmacist with each prescription and refill. Be sure to read this information carefully each time. Talk to your pediatrician regarding the use of this medicine in children. Special care may be needed. Overdosage: If you think you have taken too much of this medicine contact a poison control center or emergency room at once. NOTE: This medicine is only for you. Do not share this medicine with others. What if I miss a dose? If you miss a dose, take it as soon as you can. If it is almost time for your next dose, take only that dose. Do not take double or extra doses. What may interact with this medicine? Do  not take this medication with any of the following medicines: -narcotic medicines for cough -sodium oxybate This medicine may also interact with the following medications: -alcohol -antihistamines for allergy, cough and cold -antiviral medicines for HIV or AIDS -certain medicines for anxiety or sleep -certain medicines for depression, like amitriptyline, fluoxetine, sertraline -certain medicines for fungal infections like ketoconazole and itraconazole -certain medicines for seizures like carbamazepine, phenobarbital, phenytoin, primidone -general anesthetics like halothane, isoflurane, methoxyflurane, propofol -local anesthetics like lidocaine, pramoxine, tetracaine -medicines that relax muscles for surgery -narcotic medicines for pain -phenothiazines like chlorpromazine, mesoridazine, prochlorperazine, thioridazine This list may not describe all possible interactions. Give your health care provider a list of all the medicines, herbs, non-prescription drugs, or dietary supplements you use. Also tell them if you smoke, drink alcohol, or use illegal drugs. Some items may interact with your medicine. What should I watch for while using this medicine? Tell your doctor or health care professional if your symptoms do not start to get better or if they get worse. Do not stop taking except on your doctor's advice. You may develop a severe reaction. Your doctor will tell you how much medicine to take. You may get drowsy or dizzy. Do not drive, use machinery, or do anything that needs mental alertness until you know how this medicine affects you. To reduce the risk of dizzy and fainting spells, do not stand or sit up quickly, especially if you are an older patient. Alcohol may increase dizziness and drowsiness. Avoid alcoholic drinks. If you are taking another medicine that also causes drowsiness, you may have more side   effects. Give your health care provider a list of all medicines you use. Your doctor  will tell you how much medicine to take. Do not take more medicine than directed. Call emergency for help if you have problems breathing or unusual sleepiness. The use of this medicine may increase the chance of suicidal thoughts or actions. Pay special attention to how you are responding while on this medicine. Any worsening of mood, or thoughts of suicide or dying should be reported to your health care professional right away. What side effects may I notice from receiving this medicine? Side effects that you should report to your doctor or health care professional as soon as possible: -allergic reactions like skin rash, itching or hives, swelling of the face, lips, or tongue -breathing problems -confusion -loss of balance or coordination -signs and symptoms of low blood pressure like dizziness; feeling faint or lightheaded, falls; unusually weak or tired -suicidal thoughts or mood changes Side effects that usually do not require medical attention (report to your doctor or health care professional if they continue or are bothersome): -dizziness -headache -tiredness -upset stomach This list may not describe all possible side effects. Call your doctor for medical advice about side effects. You may report side effects to FDA at 1-800-FDA-1088. Where should I keep my medicine? Keep out of the reach of children. This medicine can be abused. Keep your medicine in a safe place to protect it from theft. Do not share this medicine with anyone. Selling or giving away this medicine is dangerous and against the law. This medicine may cause accidental overdose and death if taken by other adults, children, or pets. Mix any unused medicine with a substance like cat litter or coffee grounds. Then throw the medicine away in a sealed container like a sealed bag or a coffee can with a lid. Do not use the medicine after the expiration date. Store at room temperature between 15 and 30 degrees C (59 and 86 degrees F).  Protect from light. Keep container tightly closed. NOTE: This sheet is a summary. It may not cover all possible information. If you have questions about this medicine, talk to your doctor, pharmacist, or health care provider.  2018 Elsevier/Gold Standard (2016-02-26 18:46:32)  

## 2017-03-16 ENCOUNTER — Encounter: Payer: Self-pay | Admitting: Physician Assistant

## 2017-03-16 DIAGNOSIS — F5101 Primary insomnia: Secondary | ICD-10-CM

## 2017-03-16 DIAGNOSIS — R059 Cough, unspecified: Secondary | ICD-10-CM

## 2017-03-16 DIAGNOSIS — F4321 Adjustment disorder with depressed mood: Secondary | ICD-10-CM

## 2017-03-16 DIAGNOSIS — F411 Generalized anxiety disorder: Secondary | ICD-10-CM

## 2017-03-16 DIAGNOSIS — R05 Cough: Secondary | ICD-10-CM

## 2017-03-17 MED ORDER — PSEUDOEPH-BROMPHEN-DM 30-2-10 MG/5ML PO SYRP: 5.0000 mL | ORAL_SOLUTION | Freq: Four times a day (QID) | ORAL | 0 refills | Status: DC | PRN

## 2017-03-17 MED ORDER — CITALOPRAM HYDROBROMIDE 20 MG PO TABS: 20.0000 mg | ORAL_TABLET | Freq: Every day | ORAL | 3 refills | Status: DC

## 2017-03-17 MED ORDER — CLONAZEPAM 0.5 MG PO TABS: 0.5000 mg | ORAL_TABLET | Freq: Two times a day (BID) | ORAL | 5 refills | Status: DC | PRN

## 2017-03-17 MED ORDER — AZITHROMYCIN 250 MG PO TABS: ORAL_TABLET | ORAL | 0 refills | Status: DC

## 2017-04-14 ENCOUNTER — Ambulatory Visit: Payer: Managed Care, Other (non HMO) | Admitting: Physician Assistant

## 2017-05-01 ENCOUNTER — Encounter: Payer: Self-pay | Admitting: Physician Assistant

## 2017-05-20 IMAGING — MG MM DIAG BREAST TOMO UNI RIGHT
9 of 10 series · 9 of 18 positions shown · non-contrast
Comparison: Previous exam(s).

CLINICAL DATA: Screening recall for a possible right breast mass.
The patient has strong family history for breast cancer.

EXAM:
DIGITAL DIAGNOSTIC RIGHT MAMMOGRAM WITH 3D TOMOSYNTHESIS AND CAD
RIGHT BREAST ULTRASOUND

[R MLO synth-2D]
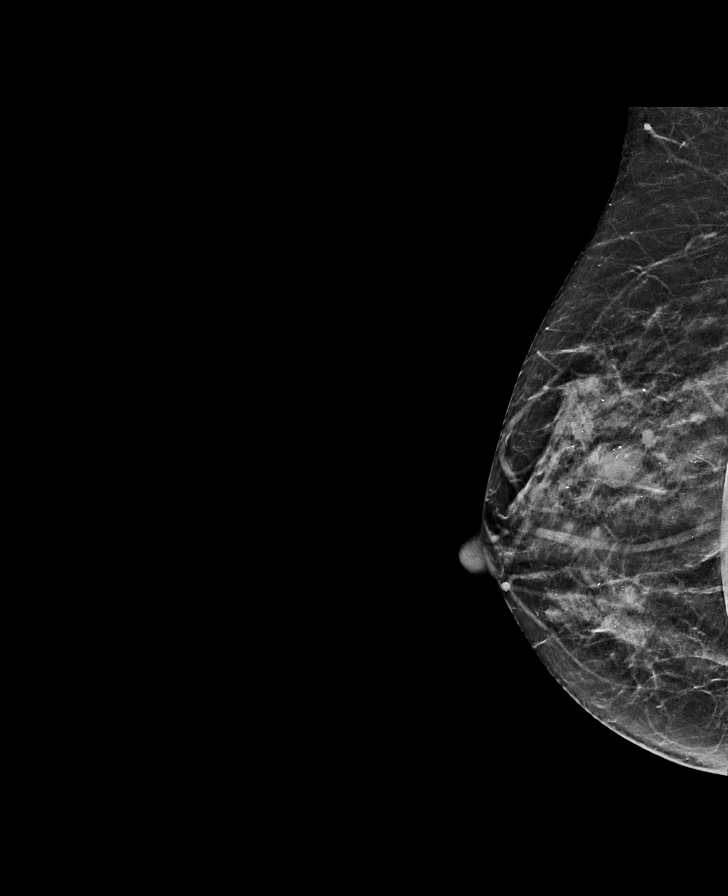

[R CC synth-2D]
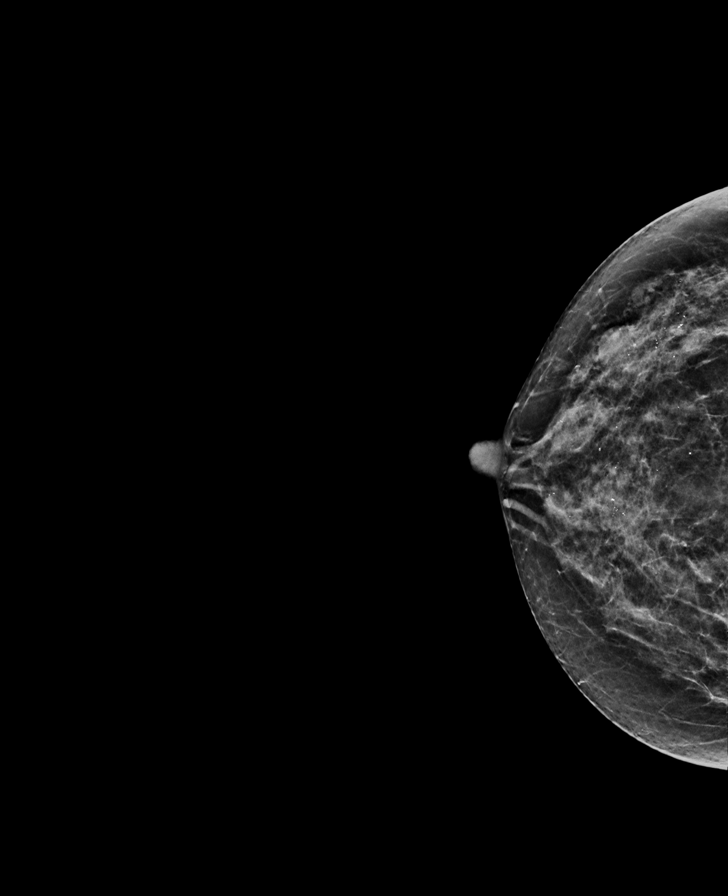

[R MLO (1 of 2)]
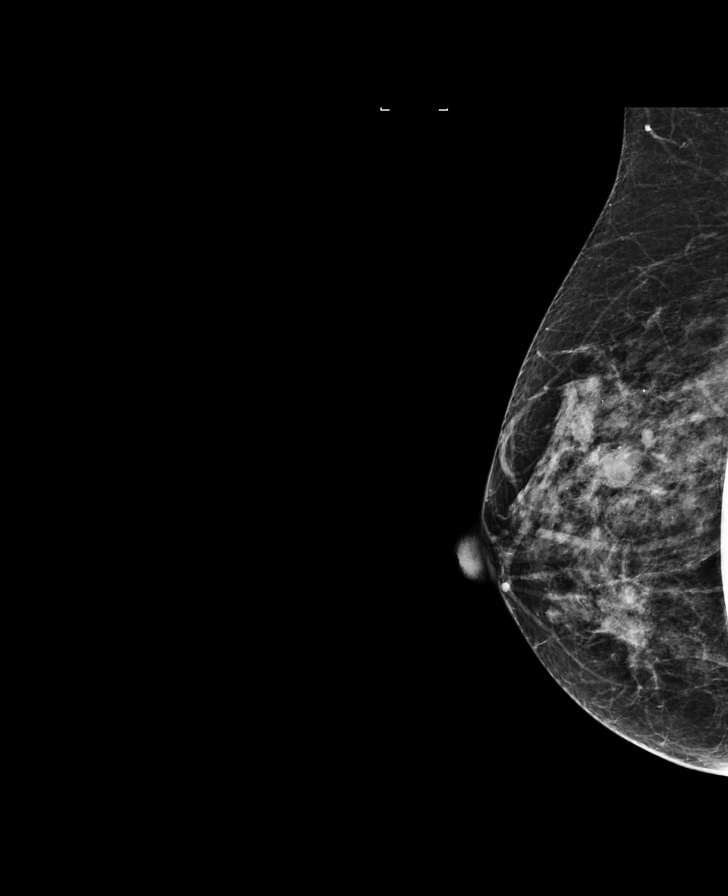

[R CC (1 of 2)]
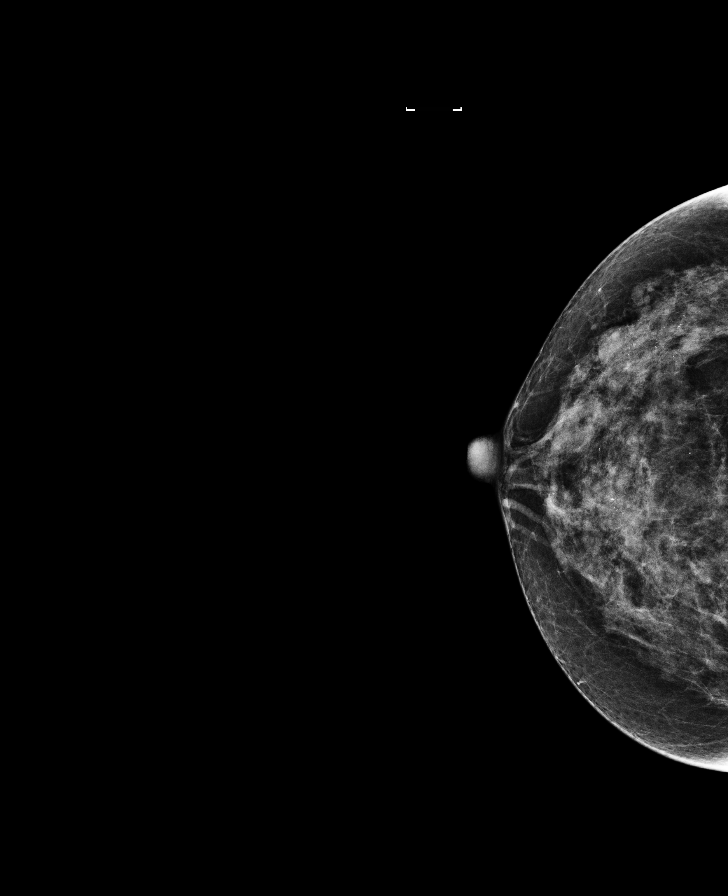

[R CC tomo]
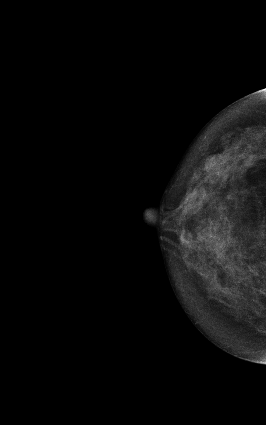

[R MLO tomo (1 of 2)]
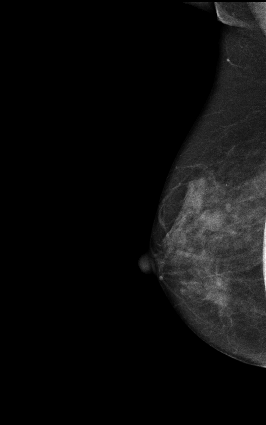

[R MLO tomo (2 of 2) · tomo slice 27/52.0]
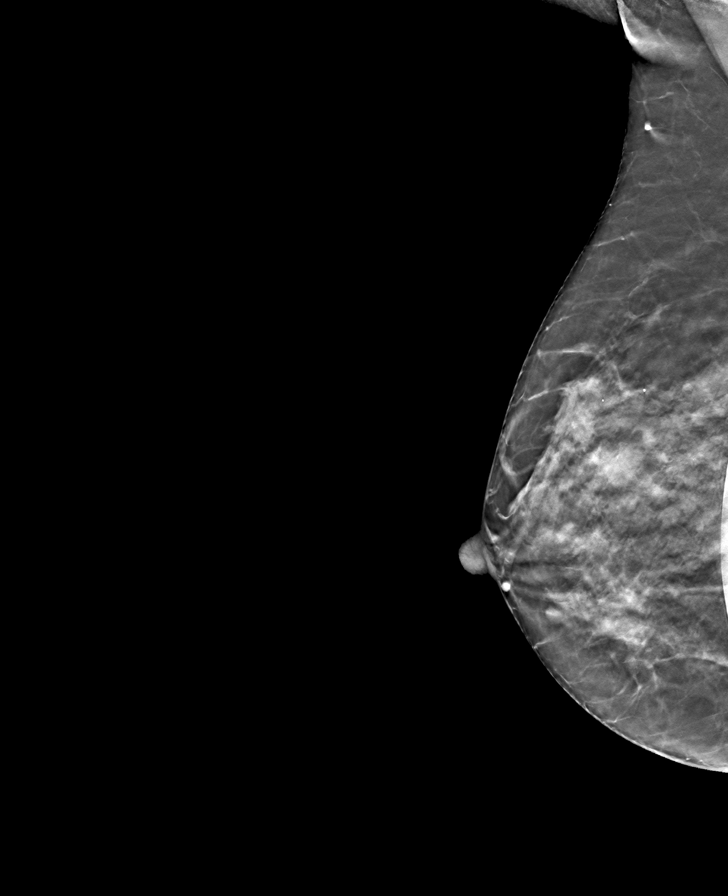

[R MLO (2 of 2)]
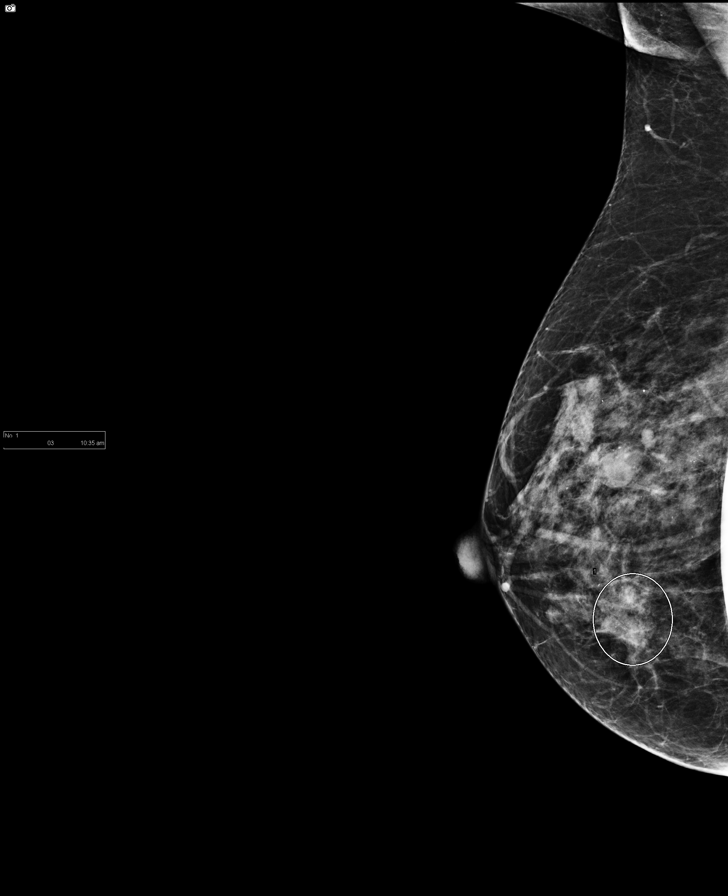

[R CC (2 of 2)]
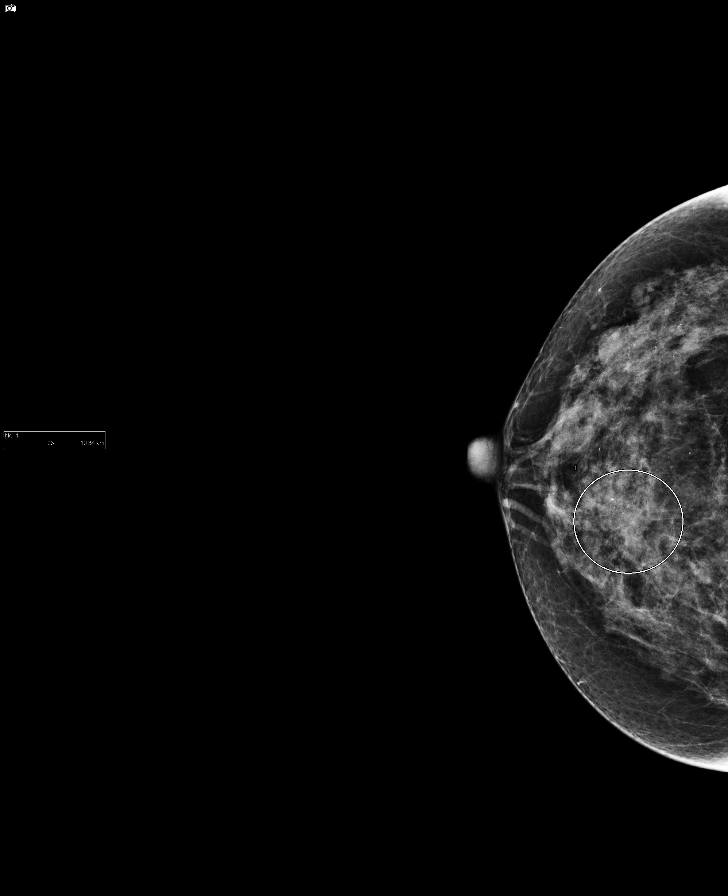

[9 of 18 positions shown; findings below may reference images not displayed]

ACR Breast Density Category c: The breast tissue is heterogeneously
dense, which may obscure small masses.
FINDINGS: In the slightly lower inner quadrant of the right breast, middle
depth there is a focal asymmetry/possible mass associated with a few
calcifications. Several circumscribed masses are seen elsewhere
throughout the right breast.

Mammographic images were processed with CAD.

Physical exam of the lower-inner quadrant of the right breast
demonstrates a firm palpable mass.

Ultrasound targeted to the palpable area of concern at 5 o'clock, 3
cm from the nipple demonstrates a hypoechoic mass with indistinct
margins and several echogenic internal foci corresponding with the
calcifications seen mammographically. This mass measures
approximately 1.1 x 1.1 x 0.8 cm in corresponds with the
mammographic mass of concern. There is an adjacent hypoechoic oval
mass which may represent a cyst, though the possibility that this is
a part of the larger mass cannot be excluded. If this smaller mass
is included with the measurements with the dominant larger mass,
then the mass would measure 1.1 x 0.8 x 1.7 cm. The right axilla was
imaged demonstrating normal appearing lymph nodes.
IMPRESSION: 1.  There is a suspicious mass in the right breast at 5 o'clock.

2.  No evidence of right axillary lymphadenopathy.

RECOMMENDATION:
1. Ultrasound-guided biopsy is recommended for the right breast mass
at 5 o'clock.

2. Following ultrasound-guided biopsy, bilateral breast MRI is
recommended for this high risk patient with strong family history of
breast cancer and dense breast tissue. Additionally, per American
Cancer Society guidelines, if the patient has a calculated lifetime
risk of developing breast cancer of greater than 20%, annual
screening MRI of the breasts would be recommended at the time of
screening mammography.

I have discussed the findings and recommendations with the patient.
Results were also provided in writing at the conclusion of the
visit. If applicable, a reminder letter will be sent to the patient
regarding the next appointment.

BI-RADS CATEGORY  Category 4C: High suspicion for malignancy.

## 2018-06-20 ENCOUNTER — Ambulatory Visit: Payer: No Typology Code available for payment source | Admitting: Physician Assistant

## 2018-06-20 ENCOUNTER — Encounter: Payer: Self-pay | Admitting: Physician Assistant

## 2018-06-20 VITALS — BP 148/88 | HR 91 | Temp 98.7°F | Resp 16 | Wt 144.4 lb

## 2018-06-20 DIAGNOSIS — Z171 Estrogen receptor negative status [ER-]: Secondary | ICD-10-CM | POA: Insufficient documentation

## 2018-06-20 DIAGNOSIS — C50311 Malignant neoplasm of lower-inner quadrant of right female breast: Secondary | ICD-10-CM

## 2018-06-20 DIAGNOSIS — Z0000 Encounter for general adult medical examination without abnormal findings: Secondary | ICD-10-CM

## 2018-06-20 DIAGNOSIS — Z1211 Encounter for screening for malignant neoplasm of colon: Secondary | ICD-10-CM | POA: Diagnosis not present

## 2018-06-20 DIAGNOSIS — R030 Elevated blood-pressure reading, without diagnosis of hypertension: Secondary | ICD-10-CM

## 2018-06-20 DIAGNOSIS — F5101 Primary insomnia: Secondary | ICD-10-CM

## 2018-06-20 DIAGNOSIS — F17209 Nicotine dependence, unspecified, with unspecified nicotine-induced disorders: Secondary | ICD-10-CM

## 2018-06-20 DIAGNOSIS — Z853 Personal history of malignant neoplasm of breast: Secondary | ICD-10-CM

## 2018-06-20 DIAGNOSIS — R5383 Other fatigue: Secondary | ICD-10-CM

## 2018-06-20 DIAGNOSIS — Z803 Family history of malignant neoplasm of breast: Secondary | ICD-10-CM

## 2018-06-20 DIAGNOSIS — Z1231 Encounter for screening mammogram for malignant neoplasm of breast: Secondary | ICD-10-CM | POA: Diagnosis not present

## 2018-06-20 DIAGNOSIS — Z1239 Encounter for other screening for malignant neoplasm of breast: Secondary | ICD-10-CM

## 2018-06-20 DIAGNOSIS — Z23 Encounter for immunization: Secondary | ICD-10-CM | POA: Diagnosis not present

## 2018-06-20 NOTE — Progress Notes (Signed)
Patient: Abigail Reeves Female    DOB: 12/15/1965   52 y.o.   MRN: [id number] Visit Date: 06/20/2018  Today's Provider: Mar Daring, PA-C   No chief complaint on file.  Subjective:    HPI Patient here today with c/o needing several referrals before her job ends. She reports she needs several test done.   She does have personal history of breast cancer and has not had a mammogram or PET scan done recently. Was followed by [facility] but has not seen them since May 2018. Wanting to establish locally. Requesting mammogram and blood work.  Also in need of a colonoscopy. Never had one. No known family history of colon cancer.   Pap: s/p hysterectomy      No Known Allergies   Current Outpatient Medications:  .  citalopram (CELEXA) 20 MG tablet, Take 1 tablet (20 mg total) by mouth daily. (Patient not taking: Reported on 06/20/2018), Disp: 30 tablet, Rfl: 3 .  clonazePAM (KLONOPIN) 0.5 MG tablet, Take 1 tablet (0.5 mg total) by mouth 2 (two) times daily as needed for anxiety. (Patient not taking: Reported on 06/20/2018), Disp: 60 tablet, Rfl: 5  Review of Systems  Constitutional: Negative.   Respiratory: Negative.   Cardiovascular: Negative.   Gastrointestinal: Negative.   Neurological: Negative.   Psychiatric/Behavioral: Negative.     Social History   Tobacco Use  . Smoking status: Current Every Day Smoker    Packs/day: 1.00    Years: 25.00    Pack years: 25.00    Types: Cigarettes  . Smokeless tobacco: Never Used  Substance Use Topics  . Alcohol use: Yes    Alcohol/week: 20.0 standard drinks    Types: 20 Cans of beer per week   Objective:   BP (!) 148/88 (BP Location: Left Arm, Patient Position: Sitting, Cuff Size: Normal)   Pulse 91   Temp 98.7 F (37.1 C) (Oral)   Resp 16   Wt 144 lb 6.4 oz (65.5 kg)   LMP 11/24/1997 (Exact Date)   SpO2 97%   BMI 23.31 kg/m  Vitals:   06/20/18 0850  BP: (!) 148/88  Pulse: 91  Resp: 16  Temp: 98.7  F (37.1 C)  TempSrc: Oral  SpO2: 97%  Weight: 144 lb 6.4 oz (65.5 kg)     Physical Exam  Constitutional: She is oriented to person, place, and time. She appears well-developed and well-nourished. No distress.  HENT:  Head: Normocephalic and atraumatic.  Right Ear: Hearing, tympanic membrane, external ear and ear canal normal.  Left Ear: Hearing, tympanic membrane, external ear and ear canal normal.  Nose: Nose normal.  Mouth/Throat: Uvula is midline, oropharynx is clear and moist and mucous membranes are normal. No oropharyngeal exudate.  Eyes: Pupils are equal, round, and reactive to light. Conjunctivae and EOM are normal. Right eye exhibits no discharge. Left eye exhibits no discharge. No scleral icterus.  Neck: Normal range of motion. Neck supple. No JVD present. Carotid bruit is not present. No tracheal deviation present. No thyromegaly present.  Cardiovascular: Normal rate, regular rhythm, normal heart sounds and intact distal pulses. Exam reveals no gallop and no friction rub.  No murmur heard. Pulmonary/Chest: Effort normal and breath sounds normal. No respiratory distress. She has no wheezes. She has no rales. She exhibits no tenderness. Right breast exhibits no inverted nipple, no mass, no nipple discharge, no skin change and no tenderness. Left breast exhibits no inverted nipple, no mass, no nipple  discharge, no skin change and no tenderness. No breast swelling, tenderness, discharge or bleeding. Breasts are symmetrical.  Abdominal: Soft. Bowel sounds are normal. She exhibits no distension and no mass. There is no tenderness. There is no rebound and no guarding.  Musculoskeletal: Normal range of motion. She exhibits no edema or tenderness.  Lymphadenopathy:    She has no cervical adenopathy.  Neurological: She is alert and oriented to person, place, and time.  Skin: Skin is warm and dry. No rash noted. She is not diaphoretic.  Psychiatric: She has a normal mood and affect. Her  behavior is normal. Judgment and thought content normal.  Vitals reviewed.       Assessment & Plan:     1. Annual physical exam Normal physical exam today. Will check labs as below and f/u pending lab results. If labs are stable and WNL she will not need to have these rechecked for one year at her next annual physical exam. She is to call the office in the meantime if she has any acute issue, questions or concerns. - CBC w/Diff/Platelet - Comprehensive Metabolic Panel (CMET) - TSH - Lipid Profile - HgB A1c  2. Breast cancer screening Breast exam today was normal. There is no family history of breast cancer. She does perform regular self breast exams. Mammogram was ordered as below. Information for [facility] Breast clinic was given to patient so she may schedule her mammogram at her convenience. - MM 3D SCREEN BREAST UNI LEFT; Future  3. Colon cancer screening Referred to GI for colonoscopy. - Ambulatory referral to Gastroenterology  4. Need for influenza vaccination Flu vaccine given today without complication. Patient sat upright for 15 minutes to check for adverse reaction before being released. - Flu Vaccine QUAD 36+ mos IM  5. Family history of breast cancer Family history as well as personal history.  - MM 3D SCREEN BREAST UNI LEFT; Future  6. History of breast cancer Was followed by [facility] but not seen in over a year. Requesting referral to local Oncology.  - Ambulatory referral to Oncology - Cancer antigen 27.29 - MM 3D SCREEN BREAST UNI LEFT; Future  7. Elevated blood-pressure reading without diagnosis of hypertension Will continue to monitor. Home readings are reported normal.  - CBC w/Diff/Platelet - Comprehensive Metabolic Panel (CMET) - TSH  8. Fatigue, unspecified type Will check labs as below and f/u pending results. - CBC w/Diff/Platelet - Comprehensive Metabolic Panel (CMET) - TSH  9. Primary insomnia Doing well with OTC medications currently.    10. Tobacco use disorder, continuous Does not desire to quit.   11. Malignant neoplasm of lower-inner quadrant of right breast of female, estrogen receptor negative ([place]) Personal history s/p right mastectomy.       Mar Daring, PA-C  [facility]

## 2018-06-21 ENCOUNTER — Other Ambulatory Visit: Payer: Self-pay

## 2018-06-21 ENCOUNTER — Encounter: Payer: Self-pay | Admitting: Physician Assistant

## 2018-06-21 ENCOUNTER — Telehealth: Payer: Self-pay

## 2018-06-21 DIAGNOSIS — Z1211 Encounter for screening for malignant neoplasm of colon: Secondary | ICD-10-CM

## 2018-06-21 LAB — COMPREHENSIVE METABOLIC PANEL
A/G RATIO: 2.1 (ref 1.2–2.2)
ALK PHOS: 70 IU/L (ref 39–117)
ALT: 19 IU/L (ref 0–32)
AST: 24 IU/L (ref 0–40)
Albumin: 4.6 g/dL (ref 3.5–5.5)
BILIRUBIN TOTAL: 0.4 mg/dL (ref 0.0–1.2)
BUN/Creatinine Ratio: 16 (ref 9–23)
BUN: 9 mg/dL (ref 6–24)
CHLORIDE: 99 mmol/L (ref 96–106)
CO2: 24 mmol/L (ref 20–29)
Calcium: 9.6 mg/dL (ref 8.7–10.2)
Creatinine, Ser: 0.58 mg/dL (ref 0.57–1.00)
GFR calc Af Amer: 123 mL/min/{1.73_m2} (ref >59)
GFR calc non Af Amer: 106 mL/min/{1.73_m2} (ref >59)
GLUCOSE: 98 mg/dL (ref 65–99)
Globulin, Total: 2.2 g/dL (ref 1.5–4.5)
POTASSIUM: 4.1 mmol/L (ref 3.5–5.2)
Sodium: 139 mmol/L (ref 134–144)
Total Protein: 6.8 g/dL (ref 6.0–8.5)

## 2018-06-21 LAB — CBC WITH DIFFERENTIAL/PLATELET
BASOS ABS: 0 10*3/uL (ref 0.0–0.2)
Basos: 0 %
EOS (ABSOLUTE): 0.1 10*3/uL (ref 0.0–0.4)
Eos: 1 %
Hematocrit: 43.6 % (ref 34.0–46.6)
Hemoglobin: 15.2 g/dL (ref 11.1–15.9)
IMMATURE GRANS (ABS): 0 10*3/uL (ref 0.0–0.1)
Immature Granulocytes: 0 %
LYMPHS ABS: 2 10*3/uL (ref 0.7–3.1)
LYMPHS: 18 %
MCH: 37 pg — AB (ref 26.6–33.0)
MCHC: 34.9 g/dL (ref 31.5–35.7)
MCV: 106 fL — ABNORMAL HIGH (ref 79–97)
MONOS ABS: 0.9 10*3/uL (ref 0.1–0.9)
Monocytes: 8 %
NEUTROS ABS: 7.8 10*3/uL — AB (ref 1.4–7.0)
Neutrophils: 73 %
PLATELETS: 302 10*3/uL (ref 150–450)
RBC: 4.11 x10E6/uL (ref 3.77–5.28)
RDW: 13.4 % (ref 12.3–15.4)
WBC: 10.8 10*3/uL (ref 3.4–10.8)

## 2018-06-21 LAB — HEMOGLOBIN A1C
Est. average glucose Bld gHb Est-mCnc: 100 mg/dL
Hgb A1c MFr Bld: 5.1 % (ref 4.8–5.6)

## 2018-06-21 LAB — TSH: TSH: 3.64 u[IU]/mL (ref 0.450–4.500)

## 2018-06-21 LAB — LIPID PANEL
Chol/HDL Ratio: 2.3 ratio (ref 0.0–4.4)
Cholesterol, Total: 204 mg/dL — ABNORMAL HIGH (ref 100–199)
HDL: 87 mg/dL (ref >39)
LDL Calculated: 105 mg/dL — ABNORMAL HIGH (ref 0–99)
TRIGLYCERIDES: 60 mg/dL (ref 0–149)
VLDL Cholesterol Cal: 12 mg/dL (ref 5–40)

## 2018-06-21 LAB — CANCER ANTIGEN 27.29: CAN 27.29: 14.1 U/mL (ref 0.0–38.6)

## 2018-06-21 MED ORDER — NA SULFATE-K SULFATE-MG SULF 17.5-3.13-1.6 GM/177ML PO SOLN: 1.0000 | Freq: Once | ORAL | 0 refills | Status: AC

## 2018-06-21 NOTE — Telephone Encounter (Signed)
-----   Message from Mar Daring, PA-C sent at 06/21/2018 11:12 AM EDT ----- Blood count is stable. Kidney and liver function are normal. Sugar is normal. Thyroid is normal. Cholesterol up slightly when compared to last year, but overall fairly stable. Breast cancer marker normal.

## 2018-06-21 NOTE — Telephone Encounter (Signed)
Patient advised as directed below.  Thanks,  -Joseline 

## 2018-06-27 ENCOUNTER — Other Ambulatory Visit: Payer: Self-pay | Admitting: Physician Assistant

## 2018-06-27 ENCOUNTER — Ambulatory Visit
Admission: RE | Admit: 2018-06-27 | Discharge: 2018-06-27 | Disposition: A | Discharge status: Home / Self Care | Payer: No Typology Code available for payment source | Source: Ambulatory Visit | Attending: Physician Assistant | Admitting: Physician Assistant

## 2018-06-27 DIAGNOSIS — Z853 Personal history of malignant neoplasm of breast: Secondary | ICD-10-CM | POA: Diagnosis present

## 2018-06-27 DIAGNOSIS — Z1239 Encounter for other screening for malignant neoplasm of breast: Secondary | ICD-10-CM

## 2018-06-27 DIAGNOSIS — Z1231 Encounter for screening mammogram for malignant neoplasm of breast: Secondary | ICD-10-CM | POA: Diagnosis present

## 2018-06-27 DIAGNOSIS — Z803 Family history of malignant neoplasm of breast: Secondary | ICD-10-CM | POA: Diagnosis present

## 2018-07-01 NOTE — Progress Notes (Deleted)
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  INTERVAL HISTORY: ***  REVIEW OF SYSTEMS:   ROS  As per HPI. Otherwise, a complete review of systems is negative.  PAST MEDICAL HISTORY: Past Medical History:  Diagnosis Date  . Cancer ([place])    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, stage 2    PAST SURGICAL HISTORY: Past Surgical History:  Procedure Laterality Date  . ABDOMINAL HYSTERECTOMY    . AUGMENTATION MAMMAPLASTY Bilateral 2012  . AUGMENTATION MAMMAPLASTY Right 2017  . 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 BREAST EXCISIONAL BIOPSY Right 02/2016   mastecomy  . ELBOW SURGERY Left   . MASTECTOMY Right 2017   chemo mastecty with reconstruction    FAMILY HISTORY: Family History  Problem Relation Age of Onset  . Breast cancer Mother        75  . Pancreatic cancer Mother        73  . Breast cancer Maternal Aunt        63  . Breast cancer Maternal Grandmother        ?  . Diabetes Father     ADVANCED DIRECTIVES (Y/N):  N  HEALTH MAINTENANCE: Social History   Tobacco Use  . Smoking status: Current Every Day Smoker    Packs/day: 1.00    Years: 25.00    Pack years: 25.00    Types: Cigarettes  . Smokeless tobacco: Never Used  Substance Use Topics  . Alcohol use: Yes    Alcohol/week: 20.0 standard drinks    Types: 20 Cans of beer per week  . Drug use: No     Colonoscopy:  PAP:  Bone density:  Lipid panel:  No Known Allergies  Current Outpatient  Medications  Medication Sig Dispense Refill  . citalopram (CELEXA) 20 MG tablet Take 1 tablet (20 mg total) by mouth daily. (Patient not taking: Reported on 06/20/2018) 30 tablet 3  . clonazePAM (KLONOPIN) 0.5 MG tablet Take 1 tablet (0.5 mg total) by mouth 2 (two) times daily as needed for anxiety. (Patient not taking: Reported on 06/20/2018) 60 tablet 5   No current facility-administered medications for this visit.     OBJECTIVE: There were no vitals filed for this visit.   There is no height or weight on file to calculate BMI.    ECOG FS:{CHL ONC [id number]  General: Well-developed, well-nourished, no acute distress. Eyes: Pink conjunctiva, anicteric sclera. HEENT: Normocephalic, moist mucous membranes, clear oropharnyx. Lungs: Clear to auscultation bilaterally. Heart: Regular rate and rhythm. No rubs, murmurs, or gallops. Abdomen: Soft, nontender, nondistended. No organomegaly noted, normoactive bowel sounds. Musculoskeletal: No edema, cyanosis, or clubbing. Neuro: Alert, answering all questions appropriately. Cranial nerves grossly intact. Skin: No rashes or petechiae noted. Psych: Normal affect. Lymphatics: No cervical, calvicular, axillary or inguinal LAD.   LAB RESULTS:  Lab Results  Component Value Date   NA 139 06/20/2018   K 4.1 06/20/2018   CL 99 06/20/2018   CO2 24 06/20/2018   GLUCOSE 98 06/20/2018   BUN 9 06/20/2018  CREATININE 0.58 06/20/2018   CALCIUM 9.6 06/20/2018   PROT 6.8 06/20/2018   ALBUMIN 4.6 06/20/2018   AST 24 06/20/2018   ALT 19 06/20/2018   ALKPHOS 70 06/20/2018   BILITOT 0.4 06/20/2018   GFRNONAA 106 06/20/2018   GFRAA 123 06/20/2018    Lab Results  Component Value Date   WBC 10.8 06/20/2018   NEUTROABS 7.8 (H) 06/20/2018   HGB 15.2 06/20/2018   HCT 43.6 06/20/2018   MCV 106 (H) 06/20/2018   PLT 302 06/20/2018     STUDIES: Mm 3d Screen Breast W/implant Uni Left  Result Date: 06/27/2018 CLINICAL DATA:  Screening. EXAM:  DIGITAL SCREENING UNILATERAL LEFT MAMMOGRAM WITH IMPLANTS, CAD AND TOMO The patient has retropectoral implants. Standard and implant displaced views were performed. COMPARISON:  Previous exam(s). ACR Breast Density Category c: The breast tissue is heterogeneously dense, which may obscure small masses. FINDINGS: Left retropectoral silicone implant intact and unchanged. There are no findings suspicious for malignancy. Images were processed with CAD. IMPRESSION: No mammographic evidence of malignancy. A result letter of this screening mammogram will be mailed directly to the patient. RECOMMENDATION: Screening mammogram in one year. (Code:SM-B-01Y) BI-RADS CATEGORY  1:  Negative. Electronically Signed   By: Marin Olp M.D.   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  PLAN:    1. 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 She also understands that She can call clinic at any time with any questions, concerns, or complaints.   Cancer Staging History of breast cancer Staging form: Breast, AJCC 7th Edition - Clinical stage from 01/05/2016: Stage IA (T1c, N0, M0) - Signed by Lloyd Huger, MD on 01/05/2016   Lloyd Huger, MD   07/01/2018 11:39 PM

## 2018-07-02 ENCOUNTER — Inpatient Hospital Stay: Payer: No Typology Code available for payment source | Admitting: Oncology

## 2018-07-02 ENCOUNTER — Encounter: Payer: Self-pay | Admitting: Oncology

## 2018-07-03 ENCOUNTER — Encounter: Payer: Self-pay | Admitting: Anesthesiology

## 2018-07-03 ENCOUNTER — Encounter: Payer: Self-pay | Admitting: Physician Assistant

## 2018-07-03 DIAGNOSIS — Z1211 Encounter for screening for malignant neoplasm of colon: Secondary | ICD-10-CM

## 2018-07-06 NOTE — Discharge Instructions (Signed)
General Anesthesia, Adult, Care After °These instructions provide you with information about caring for yourself after your procedure. Your health care provider may also give you more specific instructions. Your treatment has been planned according to current medical practices, but problems sometimes occur. Call your health care provider if you have any problems or questions after your procedure. °What can I expect after the procedure? °After the procedure, it is common to have: °· Vomiting. °· A sore throat. °· Mental slowness. ° °It is common to feel: °· Nauseous. °· Cold or shivery. °· Sleepy. °· Tired. °· Sore or achy, even in parts of your body where you did not have surgery. ° °Follow these instructions at home: °For at least 24 hours after the procedure: °· Do not: °? Participate in activities where you could fall or become injured. °? Drive. °? Use heavy machinery. °? Drink alcohol. °? Take sleeping pills or medicines that cause drowsiness. °? Make important decisions or sign legal documents. °? Take care of children on your own. °· Rest. °Eating and drinking °· If you vomit, drink water, juice, or soup when you can drink without vomiting. °· Drink enough fluid to keep your urine clear or pale yellow. °· Make sure you have little or no nausea before eating solid foods. °· Follow the diet recommended by your health care provider. °General instructions °· Have a responsible adult stay with you until you are awake and alert. °· Return to your normal activities as told by your health care provider. Ask your health care provider what activities are safe for you. °· Take over-the-counter and prescription medicines only as told by your health care provider. °· If you smoke, do not smoke without supervision. °· Keep all follow-up visits as told by your health care provider. This is important. °Contact a health care provider if: °· You continue to have nausea or vomiting at home, and medicines are not helpful. °· You  cannot drink fluids or start eating again. °· You cannot urinate after 8-12 hours. °· You develop a skin rash. °· You have fever. °· You have increasing redness at the site of your procedure. °Get help right away if: °· You have difficulty breathing. °· You have chest pain. °· You have unexpected bleeding. °· You feel that you are having a life-threatening or urgent problem. °This information is not intended to replace advice given to you by your health care provider. Make sure you discuss any questions you have with your health care provider. °Document Released: 12/26/2000 Document Revised: 02/22/2016 Document Reviewed: 09/03/2015 °Elsevier Interactive Patient Education © 2018 [employer] ° °

## 2018-07-09 ENCOUNTER — Ambulatory Visit: Admission: RE | Admit: 2018-07-09 | Payer: 59 | Source: Ambulatory Visit | Admitting: Gastroenterology

## 2018-07-09 SURGERY — COLONOSCOPY WITH PROPOFOL
Anesthesia: Choice

## 2019-04-22 ENCOUNTER — Telehealth: Payer: Self-pay | Admitting: Physician Assistant

## 2019-04-22 NOTE — Telephone Encounter (Signed)
Please schedule for a virtual visit

## 2019-04-22 NOTE — Telephone Encounter (Signed)
Ok to schedule an evisit.

## 2019-04-22 NOTE — Telephone Encounter (Signed)
Please advise 

## 2019-04-22 NOTE — Telephone Encounter (Signed)
Pt is having some major anxiety.  Stress related with her children.  CB#  [phone]  [place]

## 2019-04-23 ENCOUNTER — Other Ambulatory Visit: Payer: Self-pay

## 2019-04-23 ENCOUNTER — Encounter: Payer: Self-pay | Admitting: Physician Assistant

## 2019-04-23 ENCOUNTER — Ambulatory Visit (INDEPENDENT_AMBULATORY_CARE_PROVIDER_SITE_OTHER): Payer: No Typology Code available for payment source | Admitting: Physician Assistant

## 2019-04-23 DIAGNOSIS — F4321 Adjustment disorder with depressed mood: Secondary | ICD-10-CM

## 2019-04-23 DIAGNOSIS — F411 Generalized anxiety disorder: Secondary | ICD-10-CM

## 2019-04-23 DIAGNOSIS — F5101 Primary insomnia: Secondary | ICD-10-CM | POA: Diagnosis not present

## 2019-04-23 MED ORDER — CLONAZEPAM 0.5 MG PO TABS: 0.5000 mg | ORAL_TABLET | Freq: Two times a day (BID) | ORAL | 5 refills | Status: DC | PRN

## 2019-04-23 MED ORDER — CITALOPRAM HYDROBROMIDE 20 MG PO TABS: 20.0000 mg | ORAL_TABLET | Freq: Every day | ORAL | 3 refills | Status: DC

## 2019-04-23 NOTE — Patient Instructions (Signed)
10 Relaxation Techniques That Zap Stress Fast By Jeannette Moninger   Listen  Relax. You deserve it, it's good for you, and it takes less time than you think. You don't need a spa weekend or a retreat. Each of these stress-relieving tips can get you from OMG to om in less than 15 minutes. 1. Meditate  A few minutes of practice per day can help ease anxiety. "Research suggests that daily meditation may alter the brain's neural pathways, making you more resilient to stress," says psychologist Robbie Maller Hartman, PhD, a [place] health and wellness coach. It's simple. Sit up straight with both feet on the floor. Close your eyes. Focus your attention on reciting -- out loud or silently -- a positive mantra such as "I feel at peace" or "I love myself." Place one hand on your belly to sync the mantra with your breaths. Let any distracting thoughts float by like clouds. 2. Breathe Deeply  Take a 5-minute break and focus on your breathing. Sit up straight, eyes closed, with a hand on your belly. Slowly inhale through your nose, feeling the breath start in your abdomen and work its way to the top of your head. Reverse the process as you exhale through your mouth.  "Deep breathing counters the effects of stress by slowing the heart rate and lowering blood pressure," psychologist Judith Tutin, PhD, says. She's a certified life coach in [place], [place] 3. Be Present  Slow down.  "Take 5 minutes and focus on only one behavior with awareness," Tutin says. Notice how the air feels on your face when you're walking and how your feet feel hitting the ground. Enjoy the texture and taste of each bite of food. When you spend time in the moment and focus on your senses, you should feel less tense. 4. Reach Out  Your social network is one of your best tools for handling stress. Talk to others -- preferably face to face, or at least on the phone. Share what's going on. You can get a fresh perspective while keeping your  connection strong. 5. Tune In to Your Body  Mentally scan your body to get a sense of how stress affects it each day. Lie on your back, or sit with your feet on the floor. Start at your toes and work your way up to your scalp, noticing how your body feels.  "Simply be aware of places you feel tight or loose without trying to change anything," Tutin says. For 1 to 2 minutes, imagine each deep breath flowing to that body part. Repeat this process as you move your focus up your body, paying close attention to sensations you feel in each body part. 6. Decompress  Place a warm heat wrap around your neck and shoulders for 10 minutes. Close your eyes and relax your face, neck, upper chest, and back muscles. Remove the wrap, and use a tennis ball or foam roller to massage away tension.  "Place the ball between your back and the wall. Lean into the ball, and hold gentle pressure for up to 15 seconds. Then move the ball to another spot, and apply pressure," says Cathy Benninger, a nurse practitioner and assistant professor at [facility] in [place]. 7. Laugh Out Loud  A good belly laugh doesn't just lighten the load mentally. It lowers cortisol, your body's stress hormone, and boosts brain chemicals called endorphins, which help your mood. Lighten up by tuning in to your favorite sitcom or video, reading   the comics, or chatting with someone who makes you smile. 8. Crank Up the Tunes  Research shows that listening to soothing music can lower blood pressure, heart rate, and anxiety. "Create a playlist of songs or nature sounds (the ocean, a bubbling brook, birds chirping), and allow your mind to focus on the different melodies, instruments, or singers in the piece," Benninger says. You also can blow off steam by rocking out to more upbeat tunes -- or singing at the top of your lungs! 9. Get Moving  You don't have to run in order to get a runner's high. All forms of exercise,  including yoga and walking, can ease depression and anxiety by helping the brain release feel-good chemicals and by giving your body a chance to practice dealing with stress. You can go for a quick walk around the block, take the stairs up and down a few flights, or do some stretching exercises like head rolls and shoulder shrugs. 10. Be Grateful  Keep a gratitude journal or several (one by your bed, one in your purse, and one at work) to help you remember all the things that are good in your life.  "Being grateful for your blessings cancels out negative thoughts and worries," says Joni Emmerling, a wellness coach in [place], [place].  Use these journals to savor good experiences like a child's smile, a sunshine-filled day, and good health. Don't forget to celebrate accomplishments like mastering a new task at work or a new hobby. When you start feeling stressed, spend a few minutes looking through your notes to remind yourself what really matters.   
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
## 2019-11-15 ENCOUNTER — Other Ambulatory Visit: Payer: Self-pay | Admitting: Physician Assistant

## 2019-11-15 DIAGNOSIS — F411 Generalized anxiety disorder: Secondary | ICD-10-CM

## 2019-11-15 DIAGNOSIS — F5101 Primary insomnia: Secondary | ICD-10-CM

## 2019-11-15 NOTE — Telephone Encounter (Signed)
Requested medications are due for refill today? Yes  Requested medications are on active medication list?  Yes  Last Refill:   04/23/2019 #60 with 5 refills  Future visit scheduled?  No  Notes to Clinic:

## 2020-01-13 ENCOUNTER — Other Ambulatory Visit: Payer: Self-pay

## 2020-01-13 ENCOUNTER — Ambulatory Visit (INDEPENDENT_AMBULATORY_CARE_PROVIDER_SITE_OTHER): Payer: No Typology Code available for payment source | Admitting: Physician Assistant

## 2020-01-13 ENCOUNTER — Encounter: Payer: Self-pay | Admitting: Physician Assistant

## 2020-01-13 VITALS — BP 131/77 | HR 86 | Temp 97.3°F | Resp 16 | Ht 65.0 in | Wt 161.8 lb

## 2020-01-13 DIAGNOSIS — Z6826 Body mass index (BMI) 26.0-26.9, adult: Secondary | ICD-10-CM

## 2020-01-13 DIAGNOSIS — Z0000 Encounter for general adult medical examination without abnormal findings: Secondary | ICD-10-CM | POA: Diagnosis not present

## 2020-01-13 DIAGNOSIS — Z853 Personal history of malignant neoplasm of breast: Secondary | ICD-10-CM | POA: Diagnosis not present

## 2020-01-13 DIAGNOSIS — M542 Cervicalgia: Secondary | ICD-10-CM

## 2020-01-13 DIAGNOSIS — Z1239 Encounter for other screening for malignant neoplasm of breast: Secondary | ICD-10-CM | POA: Diagnosis not present

## 2020-01-13 NOTE — Progress Notes (Signed)
Complete physical exam      Patient: Abigail Reeves   DOB: 03/07/1966   54 y.o. Female  MRN: [id number] Visit Date: 01/13/2020  Today's healthcare provider: Mar Daring, PA-C  Subjective:    Chief Complaint  Patient presents with  . Annual Exam    Abigail Reeves is a 54 y.o. female who presents today for a complete physical exam. HPI  Patient reports that she is doing well overall. She is exercising. She is sleeping well. Mammogram:06/27/2018-Normal mammogram. Repeat screening in one year.  She does have an acute issue today. When she was 54 years old she had a pot of hot coffee accidentally dropped on her. She has scarring from the lower jaw down the neck and on the chest. In the upper neck, just under the left jaw, she is having a pulling, tightening sensation. She also notices a "knot" under the scar that comes and goes. Over the last few years it has been progressing and getting worse. Does mention having some issue swallowing when it is more irritated. No weight loss.   Past Medical History:  Diagnosis Date  . 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 ABDOMINAL HYSTERECTOMY    . AUGMENTATION MAMMAPLASTY Bilateral 2012  . AUGMENTATION MAMMAPLASTY Right 2017  . 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 BREAST EXCISIONAL BIOPSY Right 02/2016   mastecomy  . ELBOW SURGERY Left   . MASTECTOMY Right 2017   chemo mastecty with reconstruction   Social History   Socioeconomic History  . Marital status: Single    Spouse name: Not on file  . Number of children: 2  . Years of education: college  . Highest education level: Not on file  Occupational History    Employer: [employer]  Tobacco Use  . Smoking status: Current Every Day Smoker    Packs/day: 1.00    Years: 25.00    Pack years: 25.00    Types: Cigarettes  . Smokeless tobacco: Never Used    Substance and Sexual Activity  . Alcohol use: Yes    Alcohol/week: 20.0 standard drinks    Types: 20 Cans of beer per week  . Drug use: No  . Sexual activity: Not on file  Other Topics Concern  . Not on file  Social History Narrative  . Not on file   Social Determinants of Health   Financial Resource Strain:   . Difficulty of Paying Living Expenses:   Food Insecurity:   . Worried About Charity fundraiser in the Last Year:   . Arboriculturist in the Last Year:   Transportation Needs:   . Film/video editor (Medical):   Marland Kitchen Lack of Transportation (Non-Medical):   Physical Activity:   . Days of Exercise per Week:   . Minutes of Exercise per Session:   Stress:   . Feeling of Stress :   Social Connections:   . Frequency of Communication with Friends and Family:   . Frequency of Social Gatherings with Friends and Family:   . Attends Religious Services:   . Active Member of Clubs or Organizations:   . Attends Archivist Meetings:   Marland Kitchen Marital Status:   Intimate Partner Violence:   . Fear of Current or Ex-Partner:   . Emotionally Abused:   Marland Kitchen Physically Abused:   . Sexually  Abused:    Family Status  Relation Name Status  . Mother  Deceased  . Mat Aunt  Deceased  . [employer] Deceased  . Father  Alive  . Brother  Alive  . Daughter  Alive  . Son  Alive   Family History  Problem Relation Age of Onset  . Breast cancer Mother        80  . Pancreatic cancer Mother        50  . Breast cancer Maternal Aunt        63  . Breast cancer Maternal Grandmother        ?  . Diabetes Father    No Known Allergies  Patient Care Team: Mar Daring, PA-C as PCP - General (Family Medicine) Rico Junker, RN as Registered Nurse Theodore Demark, RN as Registered Nurse Lloyd Huger, MD as Consulting Physician (Oncology) Bary Castilla Forest Gleason, MD (General Surgery)   Medications: Outpatient Medications Prior to Visit  Medication Sig  . clonazePAM  (KLONOPIN) 0.5 MG tablet TAKE ONE TABLET BY MOUTH TWICE DAILY AS NEEDED FOR ANXIETY  . citalopram (CELEXA) 20 MG tablet Take 1 tablet (20 mg total) by mouth daily.   No facility-administered medications prior to visit.    Review of Systems  Constitutional: Positive for unexpected weight change.  HENT: Positive for postnasal drip, sinus pressure and trouble swallowing ("scarring"). Negative for congestion, rhinorrhea, sinus pain and sore throat.   Eyes: Negative.   Respiratory: Negative.   Cardiovascular: Negative.   Gastrointestinal: Negative.   Endocrine: Negative.   Genitourinary: Negative.   Musculoskeletal: Positive for arthralgias and back pain.  Skin: Negative.   Allergic/Immunologic: Positive for environmental allergies.  Neurological: Negative.   Hematological: Bruises/bleeds easily.  Psychiatric/Behavioral: Negative.          Objective:    BP 131/77 (BP Location: Right Arm, Patient Position: Sitting, Cuff Size: Large)   Pulse 86   Temp (!) 97.3 F (36.3 C) (Temporal)   Resp 16   Ht 5\' 5"  (1.651 m)   Wt 161 lb 12.8 oz (73.4 kg)   LMP 11/24/1997 (Exact Date)   BMI 26.92 kg/m     Physical Exam Vitals reviewed.  Constitutional:      General: She is not in acute distress.    Appearance: Normal appearance. She is well-developed. She is not ill-appearing or diaphoretic.  HENT:     Head: Normocephalic and atraumatic.     Right Ear: Tympanic membrane, ear canal and external ear normal.     Left Ear: Tympanic membrane, ear canal and external ear normal.     Nose: Nose normal.     Mouth/Throat:     Mouth: Mucous membranes are moist.     Pharynx: Oropharynx is clear. No oropharyngeal exudate or posterior oropharyngeal erythema.  Eyes:     General: No scleral icterus.       Right eye: No discharge.        Left eye: No discharge.     Extraocular Movements: Extraocular movements intact.     Conjunctiva/sclera: Conjunctivae normal.     Pupils: Pupils are equal,  round, and reactive to light.  Neck:     Thyroid: No thyromegaly.     Vascular: No carotid bruit or JVD.     Trachea: No tracheal deviation.   Cardiovascular:     Rate and Rhythm: Normal rate and regular rhythm.     Pulses: Normal pulses.     Heart sounds:  Normal heart sounds. No murmur. No friction rub. No gallop.   Pulmonary:     Effort: Pulmonary effort is normal. No respiratory distress.     Breath sounds: Normal breath sounds. No wheezing or rales.  Chest:     Chest wall: No tenderness.     Breasts:        Right: Normal.        Left: Normal.    Abdominal:     General: Abdomen is flat. Bowel sounds are normal. There is no distension.     Palpations: Abdomen is soft. There is no mass.     Tenderness: There is no abdominal tenderness. There is no guarding or rebound.  Musculoskeletal:        General: No tenderness. Normal range of motion.     Cervical back: Normal range of motion and neck supple.     Right lower leg: No edema.     Left lower leg: No edema.  Lymphadenopathy:     Cervical: No cervical adenopathy.     Upper Body:     Right upper body: No supraclavicular, axillary or pectoral adenopathy.  Skin:    General: Skin is warm and dry.     Capillary Refill: Capillary refill takes less than 2 seconds.     Findings: No rash.  Neurological:     General: No focal deficit present.     Mental Status: She is alert and oriented to person, place, and time. Mental status is at baseline.  Psychiatric:        Mood and Affect: Mood normal.        Behavior: Behavior normal.        Thought Content: Thought content normal.        Judgment: Judgment normal.      Depression Screen  PHQ 2/9 Scores 01/13/2020 03/01/2017 01/13/2017  PHQ - 2 Score 0 2 2  PHQ- 9 Score - 8 8    No results found for any visits on 01/13/20.    Assessment & Plan:    Routine Health Maintenance and Physical Exam  Exercise Activities and Dietary recommendations Goals   None     Immunization  History  Administered Date(s) Administered  . Influenza,inj,Quad PF,6+ Mos 06/20/2018  . Tdap 03/01/2017    Health Maintenance  Topic Date Due  . HIV Screening  Never done  . COLONOSCOPY  Never done  . INFLUENZA VACCINE  05/03/2020  . MAMMOGRAM  06/27/2020  . TETANUS/TDAP  03/02/2027    Discussed health benefits of physical activity, and encouraged her to engage in regular exercise appropriate for her age and condition.   1. Annual physical exam Normal physical exam today. Will check labs as below and f/u pending lab results. If labs are stable and WNL she will not need to have these rechecked for one year at her next annual physical exam. She is to call the office in the meantime if she has any acute issue, questions or concerns. - CBC with Differential/Platelet - Comprehensive metabolic panel - Hemoglobin A1c - Lipid panel - TSH  2. Encounter for breast cancer screening using non-mammogram modality S/P mastectomy right; bilateral implants. Due for mammogram.  - MS DIGITAL SCREENING TOMO W/IMPLANTS BILAT; Future  3. History of breast cancer Doing well.  - MS DIGITAL SCREENING TOMO W/IMPLANTS BILAT; Future  4. BMI 26.0-26.9,adult Counseled patient on healthy lifestyle modifications including dieting and exercise.  - Hemoglobin A1c - Lipid panel  5. Sore neck From scar tissue.  Suspect reactive lymph node in neck. With scarring overlying and h/o breast cancer will get [place] to r/o any abnormally large lymph nodes. Will f/u pending results.  - US Soft Tissue Head/Neck (NON-THYROID); Future      Rubye Beach  [facility] [phone] (phone) [phone] (fax)  [place]

## 2020-01-13 NOTE — Patient Instructions (Signed)
[facility] [address] [place],  [place]  [address] Get Driving Directions Main: [phone]   Health Maintenance for Postmenopausal Women Menopause is a normal process in which your ability to get pregnant comes to an end. This process happens slowly over many months or years, usually between the ages of 48 and 55. Menopause is complete when you have missed your menstrual periods for 12 months. It is important to talk with your health care provider about some of the most common conditions that affect women after menopause (postmenopausal women). These include heart disease, cancer, and bone loss (osteoporosis). Adopting a healthy lifestyle and getting preventive care can help to promote your health and wellness. The actions you take can also lower your chances of developing some of these common conditions. What should I know about menopause? During menopause, you may get a number of symptoms, such as:  Hot flashes. These can be moderate or severe.  Night sweats.  Decrease in sex drive.  Mood swings.  Headaches.  Tiredness.  Irritability.  Memory problems.  Insomnia. Choosing to treat or not to treat these symptoms is a decision that you make with your health care provider. Do I need hormone replacement therapy?  Hormone replacement therapy is effective in treating symptoms that are caused by menopause, such as hot flashes and night sweats.  Hormone replacement carries certain risks, especially as you become older. If you are thinking about using estrogen or estrogen with progestin, discuss the benefits and risks with your health care provider. What is my risk for heart disease and stroke? The risk of heart disease, heart attack, and stroke increases as you age. One of the causes may be a change in the body's hormones during menopause. This can affect how your body uses dietary fats, triglycerides, and cholesterol. Heart attack and stroke  are medical emergencies. There are many things that you can do to help prevent heart disease and stroke. Watch your blood pressure  High blood pressure causes heart disease and increases the risk of stroke. This is more likely to develop in people who have high blood pressure readings, are of African descent, or are overweight.  Have your blood pressure checked: ? Every 3-5 years if you are 18-39 years of age. ? Every year if you are 40 years old or older. Eat a healthy diet   Eat a diet that includes plenty of vegetables, fruits, low-fat dairy products, and lean protein.  Do not eat a lot of foods that are high in solid fats, added sugars, or sodium. Get regular exercise Get regular exercise. This is one of the most important things you can do for your health. Most adults should:  Try to exercise for at least 150 minutes each week. The exercise should increase your heart rate and make you sweat (moderate-intensity exercise).  Try to do strengthening exercises at least twice each week. Do these in addition to the moderate-intensity exercise.  Spend less time sitting. Even light physical activity can be beneficial. Other tips  Work with your health care provider to achieve or maintain a healthy weight.  Do not use any products that contain nicotine or tobacco, such as cigarettes, e-cigarettes, and chewing tobacco. If you need help quitting, ask your health care provider.  Know your numbers. Ask your health care provider to check your cholesterol and your blood sugar (glucose). Continue to have your blood tested as directed by your health care provider. Do I need screening for   cancer? Depending on your health history and family history, you may need to have cancer screening at different stages of your life. This may include screening for:  Breast cancer.  Cervical cancer.  Lung cancer.  Colorectal cancer. What is my risk for osteoporosis? After menopause, you may be at increased  risk for osteoporosis. Osteoporosis is a condition in which bone destruction happens more quickly than new bone creation. To help prevent osteoporosis or the bone fractures that can happen because of osteoporosis, you may take the following actions:  If you are 19-50 years old, get at least 1,000 mg of calcium and at least 600 mg of vitamin D per day.  If you are older than age 50 but younger than age 70, get at least 1,200 mg of calcium and at least 600 mg of vitamin D per day.  If you are older than age 70, get at least 1,200 mg of calcium and at least 800 mg of vitamin D per day. Smoking and drinking excessive alcohol increase the risk of osteoporosis. Eat foods that are rich in calcium and vitamin D, and do weight-bearing exercises several times each week as directed by your health care provider. How does menopause affect my mental health? Depression may occur at any age, but it is more common as you become older. Common symptoms of depression include:  Low or sad mood.  Changes in sleep patterns.  Changes in appetite or eating patterns.  Feeling an overall lack of motivation or enjoyment of activities that you previously enjoyed.  Frequent crying spells. Talk with your health care provider if you think that you are experiencing depression. General instructions See your health care provider for regular wellness exams and vaccines. This may include:  Scheduling regular health, dental, and eye exams.  Getting and maintaining your vaccines. These include: ? Influenza vaccine. Get this vaccine each year before the flu season begins. ? Pneumonia vaccine. ? Shingles vaccine. ? Tetanus, diphtheria, and pertussis (Tdap) booster vaccine. Your health care provider may also recommend other immunizations. Tell your health care provider if you have ever been abused or do not feel safe at home. Summary  Menopause is a normal process in which your ability to get pregnant comes to an  end.  This condition causes hot flashes, night sweats, decreased interest in sex, mood swings, headaches, or lack of sleep.  Treatment for this condition may include hormone replacement therapy.  Take actions to keep yourself healthy, including exercising regularly, eating a healthy diet, watching your weight, and checking your blood pressure and blood sugar levels.  Get screened for cancer and depression. Make sure that you are up to date with all your vaccines. This information is not intended to replace advice given to you by your health care provider. Make sure you discuss any questions you have with your health care provider. Document Revised: 09/12/2018 Document Reviewed: 09/12/2018 Elsevier Patient Education  2020 Elsevier Inc.  

## 2020-01-14 ENCOUNTER — Telehealth: Payer: Self-pay

## 2020-01-14 LAB — HEMOGLOBIN A1C
Est. average glucose Bld gHb Est-mCnc: 97 mg/dL
Hgb A1c MFr Bld: 5 % (ref 4.8–5.6)

## 2020-01-14 LAB — CBC WITH DIFFERENTIAL/PLATELET
Basophils Absolute: 0.1 10*3/uL (ref 0.0–0.2)
Basos: 1 %
EOS (ABSOLUTE): 0.1 10*3/uL (ref 0.0–0.4)
Eos: 1 %
Hematocrit: 41.7 % (ref 34.0–46.6)
Hemoglobin: 14.5 g/dL (ref 11.1–15.9)
Immature Grans (Abs): 0.1 10*3/uL (ref 0.0–0.1)
Immature Granulocytes: 1 %
Lymphocytes Absolute: 1.7 10*3/uL (ref 0.7–3.1)
Lymphs: 20 %
MCH: 36.7 pg — ABNORMAL HIGH (ref 26.6–33.0)
MCHC: 34.8 g/dL (ref 31.5–35.7)
MCV: 106 fL — ABNORMAL HIGH (ref 79–97)
Monocytes Absolute: 0.6 10*3/uL (ref 0.1–0.9)
Monocytes: 8 %
Neutrophils Absolute: 5.9 10*3/uL (ref 1.4–7.0)
Neutrophils: 69 %
Platelets: 271 10*3/uL (ref 150–450)
RBC: 3.95 x10E6/uL (ref 3.77–5.28)
RDW: 11.8 % (ref 11.7–15.4)
WBC: 8.5 10*3/uL (ref 3.4–10.8)

## 2020-01-14 LAB — COMPREHENSIVE METABOLIC PANEL
ALT: 29 IU/L (ref 0–32)
AST: 31 IU/L (ref 0–40)
Albumin/Globulin Ratio: 1.6 (ref 1.2–2.2)
Albumin: 4.3 g/dL (ref 3.8–4.9)
Alkaline Phosphatase: 80 IU/L (ref 39–117)
BUN/Creatinine Ratio: 11 (ref 9–23)
BUN: 6 mg/dL (ref 6–24)
Bilirubin Total: 0.7 mg/dL (ref 0.0–1.2)
CO2: 21 mmol/L (ref 20–29)
Calcium: 9 mg/dL (ref 8.7–10.2)
Chloride: 101 mmol/L (ref 96–106)
Creatinine, Ser: 0.56 mg/dL — ABNORMAL LOW (ref 0.57–1.00)
GFR calc Af Amer: 122 mL/min/{1.73_m2} (ref >59)
GFR calc non Af Amer: 106 mL/min/{1.73_m2} (ref >59)
Globulin, Total: 2.7 g/dL (ref 1.5–4.5)
Glucose: 107 mg/dL — ABNORMAL HIGH (ref 65–99)
Potassium: 4.1 mmol/L (ref 3.5–5.2)
Sodium: 139 mmol/L (ref 134–144)
Total Protein: 7 g/dL (ref 6.0–8.5)

## 2020-01-14 LAB — LIPID PANEL
Chol/HDL Ratio: 2.6 ratio (ref 0.0–4.4)
Cholesterol, Total: 221 mg/dL — ABNORMAL HIGH (ref 100–199)
HDL: 84 mg/dL (ref >39)
LDL Chol Calc (NIH): 126 mg/dL — ABNORMAL HIGH (ref 0–99)
Triglycerides: 66 mg/dL (ref 0–149)
VLDL Cholesterol Cal: 11 mg/dL (ref 5–40)

## 2020-01-14 LAB — TSH: TSH: 3.77 u[IU]/mL (ref 0.450–4.500)

## 2020-01-14 NOTE — Telephone Encounter (Signed)
Pt advised.   Thanks,   -Luigi Stuckey  

## 2020-01-14 NOTE — Telephone Encounter (Signed)
-----   Message from Mar Daring, [place] sent at 01/14/2020  3:42 PM EDT ----- Blood count is normal. Kidney and liver function are normal. Sodium, potassium and calcium are normal. Sugar is normal. Cholesterol has increased compared to last year just slightly. Currently your risk of having a cardiovascular event over the next 10 years does remain low at 3.9%. Working on healthy dietary habits, increasing physical activity and working on smoking cessation can help lower this risk more. Thyroid is normal.

## 2020-01-17 ENCOUNTER — Ambulatory Visit
Admission: RE | Admit: 2020-01-17 | Discharge: 2020-01-17 | Disposition: A | Discharge status: Home / Self Care | Payer: No Typology Code available for payment source | Source: Ambulatory Visit | Attending: Physician Assistant | Admitting: Physician Assistant

## 2020-01-17 ENCOUNTER — Other Ambulatory Visit: Payer: Self-pay

## 2020-01-17 DIAGNOSIS — M542 Cervicalgia: Secondary | ICD-10-CM | POA: Insufficient documentation

## 2020-01-20 ENCOUNTER — Telehealth: Payer: Self-pay

## 2020-01-20 NOTE — Telephone Encounter (Signed)
-----   Message from Mar Daring, [place] sent at 01/20/2020  8:18 AM EDT ----- [place] was normal over the area of concern. No abnormally large lymph nodes or other concerning masses.

## 2020-01-20 NOTE — Telephone Encounter (Signed)
Patient advised as directed below. 

## 2020-03-10 NOTE — Telephone Encounter (Signed)
I am mailing the charges to you and what insurance was filed. If you need  further explanation, you will need to contact [facility]a at [phone]

## 2021-03-16 ENCOUNTER — Ambulatory Visit: Payer: No Typology Code available for payment source | Admitting: Family Medicine

## 2021-03-26 ENCOUNTER — Ambulatory Visit: Payer: No Typology Code available for payment source | Admitting: Family Medicine

## 2021-08-24 ENCOUNTER — Other Ambulatory Visit: Payer: Self-pay

## 2021-08-24 ENCOUNTER — Encounter: Payer: Self-pay | Admitting: Family Medicine

## 2021-08-24 ENCOUNTER — Ambulatory Visit (INDEPENDENT_AMBULATORY_CARE_PROVIDER_SITE_OTHER): Payer: No Typology Code available for payment source | Admitting: Family Medicine

## 2021-08-24 ENCOUNTER — Other Ambulatory Visit: Payer: Self-pay | Admitting: Family Medicine

## 2021-08-24 VITALS — BP 138/69 | HR 97 | Resp 16 | Wt 145.9 lb

## 2021-08-24 DIAGNOSIS — H9201 Otalgia, right ear: Secondary | ICD-10-CM

## 2021-08-24 DIAGNOSIS — F411 Generalized anxiety disorder: Secondary | ICD-10-CM

## 2021-08-24 DIAGNOSIS — F5101 Primary insomnia: Secondary | ICD-10-CM

## 2021-08-24 DIAGNOSIS — Z853 Personal history of malignant neoplasm of breast: Secondary | ICD-10-CM

## 2021-08-24 DIAGNOSIS — N63 Unspecified lump in unspecified breast: Secondary | ICD-10-CM | POA: Diagnosis not present

## 2021-08-24 DIAGNOSIS — F410 Panic disorder [episodic paroxysmal anxiety] without agoraphobia: Secondary | ICD-10-CM

## 2021-08-24 DIAGNOSIS — Z1231 Encounter for screening mammogram for malignant neoplasm of breast: Secondary | ICD-10-CM

## 2021-08-24 DIAGNOSIS — C50911 Malignant neoplasm of unspecified site of right female breast: Secondary | ICD-10-CM | POA: Diagnosis not present

## 2021-08-24 DIAGNOSIS — F17209 Nicotine dependence, unspecified, with unspecified nicotine-induced disorders: Secondary | ICD-10-CM

## 2021-08-24 MED ORDER — CLONAZEPAM 0.5 MG PO TABS: 0.5000 mg | ORAL_TABLET | Freq: Two times a day (BID) | ORAL | 0 refills | Status: DC | PRN

## 2021-08-24 NOTE — Assessment & Plan Note (Signed)
No s/s of infection Slight build up of wax Advised use of OTC 'debrox' to assist with wax removal

## 2021-08-24 NOTE — Progress Notes (Signed)
Established patient visit   Patient: Abigail Reeves   DOB: Oct 14, 1965   55 y.o. Female  MRN: [id number] Visit Date: 08/24/2021  Today's healthcare provider: Gwyneth Sprout, FNP   Chief Complaint  Patient presents with   Breast Pain    Patient comes in office today with concerns of changes to her breast, patient reports that she has breast implants and states that 4 days ago she noticed a "bulge" on the side of her right breast. Patient denies any trauma to breast of exertion.    Subjective    HPI HPI     Breast Pain    Additional comments: Patient comes in office today with concerns of changes to her breast, patient reports that she has breast implants and states that 4 days ago she noticed a "bulge" on the side of her right breast. Patient denies any trauma to breast of exertion.       Last edited by Minette Headland, CMA on 08/24/2021  9:28 AM.       Medications: Outpatient Medications Prior to Visit  Medication Sig   meloxicam (MOBIC) 15 MG tablet Take 15 mg by mouth daily.   [DISCONTINUED] clonazePAM (KLONOPIN) 0.5 MG tablet TAKE ONE TABLET BY MOUTH TWICE DAILY AS NEEDED FOR ANXIETY   No facility-administered medications prior to visit.    Review of Systems     Objective    BP 138/69   Pulse 97   Resp 16   Wt 145 lb 14.4 oz (66.2 kg)   LMP 11/24/1997 (Exact Date)   SpO2 100%   BMI 24.28 kg/m    Physical Exam Vitals and nursing note reviewed. Exam conducted with a chaperone present.  Constitutional:      General: She is not in acute distress.    Appearance: Normal appearance. She is normal weight. She is not ill-appearing, toxic-appearing or diaphoretic.  HENT:     Head: Normocephalic and atraumatic.     Right Ear: Tympanic membrane, ear canal and external ear normal. There is no impacted cerumen.     Ears:     Comments: Excessive wax in R ear; TM intact, no s/s of infection Advised OTC debrox to assist with wax removal Cardiovascular:      Rate and Rhythm: Normal rate and regular rhythm.     Pulses: Normal pulses.     Heart sounds: Normal heart sounds. No murmur heard.   No friction rub. No gallop.  Pulmonary:     Effort: Pulmonary effort is normal. No respiratory distress.     Breath sounds: Normal breath sounds. No stridor. No wheezing, rhonchi or rales.  Chest:     Chest wall: Swelling and edema present. No tenderness.  Breasts:    Right: Skin change present.       Comments: R- reconstruction No nipple  Abdominal:     General: Bowel sounds are normal.     Palpations: Abdomen is soft.  Musculoskeletal:        General: No swelling, tenderness, deformity or signs of injury. Normal range of motion.     Right lower leg: No edema.     Left lower leg: No edema.  Skin:    General: Skin is warm and dry.     Capillary Refill: Capillary refill takes less than 2 seconds.     Coloration: Skin is not jaundiced or pale.     Findings: No bruising, erythema, lesion or rash.  Neurological:  General: No focal deficit present.     Mental Status: She is alert and oriented to person, place, and time. Mental status is at baseline.     Cranial Nerves: No cranial nerve deficit.     Sensory: No sensory deficit.     Motor: No weakness.     Coordination: Coordination normal.  Psychiatric:        Mood and Affect: Mood is anxious. Affect is tearful.        Behavior: Behavior normal.        Thought Content: Thought content normal.        Judgment: Judgment normal.     No results found for any visits on 08/24/21.  Assessment & Plan     Problem List Items Addressed This Visit       Other   History of breast cancer   Relevant Orders   US BREAST LTD UNI RIGHT INC AXILLA   MM DIAG BREAST TOMO BILATERAL   US BREAST LTD UNI LEFT INC AXILLA   Tobacco use disorder, continuous    Refused to quit at this time; continue to reinforce at regular intervals       GAD (generalized anxiety disorder)    Request refill of PRN  benzo -father has moved in, slowing losing day to day thoughts -has also moved his large dog in -difficult caring for both of them      Relevant Medications   clonazePAM (KLONOPIN) 0.5 MG tablet   Right ear pain    No s/s of infection Slight build up of wax Advised use of OTC 'debrox' to assist with wax removal      Swelling of breast - Primary    8-10 o'clock along surgical incision; previous implant placed '17/'18 for triple negative breast cancer      Relevant Orders   MM DIAG BREAST TOMO BILATERAL   US BREAST LTD UNI LEFT INC AXILLA   Malignant neoplasm of right female breast ([place])    F/b [facility] Hx of reconstruction Noticed swelling along reconstruction line this past weekend Reassurance provided Plan for US/mammogram      RESOLVED: Primary insomnia     Return in about 3 months (around 11/24/2021) for anxiety and depression.     Vonna Kotyk, FNP, have reviewed all documentation for this visit. The documentation on 08/24/21 for the exam, diagnosis, procedures, and orders are all accurate and complete.    Gwyneth Sprout, [place] [phone] (phone) [phone] (fax)  [place]

## 2021-08-24 NOTE — Assessment & Plan Note (Signed)
Refused to quit at this time; continue to reinforce at regular intervals

## 2021-08-24 NOTE — Assessment & Plan Note (Signed)
8-10 o'clock along surgical incision; previous implant placed '17/'18 for triple negative breast cancer

## 2021-08-24 NOTE — Assessment & Plan Note (Signed)
F/b [facility] Hx of reconstruction Noticed swelling along reconstruction line this past weekend Reassurance provided Plan for US/mammogram

## 2021-08-24 NOTE — Assessment & Plan Note (Signed)
Request refill of PRN benzo -father has moved in, slowing losing day to day thoughts -has also moved his large dog in -difficult caring for both of them

## 2021-09-22 ENCOUNTER — Other Ambulatory Visit: Payer: No Typology Code available for payment source

## 2022-06-09 ENCOUNTER — Ambulatory Visit (INDEPENDENT_AMBULATORY_CARE_PROVIDER_SITE_OTHER): Payer: No Typology Code available for payment source | Admitting: Family Medicine

## 2022-06-09 ENCOUNTER — Encounter: Payer: Self-pay | Admitting: Family Medicine

## 2022-06-09 VITALS — BP 128/84 | HR 95 | Temp 98.5°F | Resp 16 | Ht 65.0 in | Wt 142.0 lb

## 2022-06-09 DIAGNOSIS — Z0000 Encounter for general adult medical examination without abnormal findings: Secondary | ICD-10-CM | POA: Diagnosis not present

## 2022-06-09 DIAGNOSIS — Z23 Encounter for immunization: Secondary | ICD-10-CM | POA: Insufficient documentation

## 2022-06-09 DIAGNOSIS — Z131 Encounter for screening for diabetes mellitus: Secondary | ICD-10-CM | POA: Insufficient documentation

## 2022-06-09 DIAGNOSIS — Z1211 Encounter for screening for malignant neoplasm of colon: Secondary | ICD-10-CM | POA: Insufficient documentation

## 2022-06-09 DIAGNOSIS — F411 Generalized anxiety disorder: Secondary | ICD-10-CM

## 2022-06-09 DIAGNOSIS — Z1159 Encounter for screening for other viral diseases: Secondary | ICD-10-CM | POA: Insufficient documentation

## 2022-06-09 DIAGNOSIS — Z114 Encounter for screening for human immunodeficiency virus [HIV]: Secondary | ICD-10-CM | POA: Insufficient documentation

## 2022-06-09 DIAGNOSIS — Z1231 Encounter for screening mammogram for malignant neoplasm of breast: Secondary | ICD-10-CM | POA: Insufficient documentation

## 2022-06-09 DIAGNOSIS — F17209 Nicotine dependence, unspecified, with unspecified nicotine-induced disorders: Secondary | ICD-10-CM

## 2022-06-09 DIAGNOSIS — F410 Panic disorder [episodic paroxysmal anxiety] without agoraphobia: Secondary | ICD-10-CM

## 2022-06-09 MED ORDER — CLONAZEPAM 0.5 MG PO TABS: 0.5000 mg | ORAL_TABLET | Freq: Every day | ORAL | 0 refills | Status: DC | PRN

## 2022-06-09 NOTE — Progress Notes (Signed)
Complete physical exam   Patient: Abigail Reeves   DOB: 01-06-1966   56 y.o. Female  MRN: [id number] Visit Date: 06/09/2022  Today's healthcare provider: Gwyneth Sprout, FNP  Re Introduced to nurse practitioner role and practice setting.  All questions answered.  Discussed provider/patient relationship and expectations.   I,Tiffany J Bragg,acting as a scribe for Gwyneth Sprout, FNP.,have documented all relevant documentation on the behalf of Gwyneth Sprout, FNP,as directed by  Gwyneth Sprout, FNP while in the presence of Gwyneth Sprout, FNP.   Chief Complaint  Patient presents with   Annual Exam   Subjective    Abigail Reeves is a 56 y.o. female who presents today for a complete physical exam.  She reports consuming a general diet. Home exercise routine includes walking, weights. She generally feels fairly well. She reports sleeping poorly. She does have additional problems to discuss today. Does not sleep well at night and has frequent urination for years.  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   BREAST EXCISIONAL BIOPSY Right 02/2016   mastecomy   ELBOW SURGERY Left    MASTECTOMY Right 2017   chemo mastecty with reconstruction   Social History   Socioeconomic History   Marital status: Single    Spouse name: Not on file   Number of children: 2   Years of education: college   Highest education level: Not on file  Occupational History    Employer: [employer]  Tobacco Use   Smoking status: Every Day    Packs/day: 1.00    Years: 25.00    Total pack years: 25.00    Types: Cigarettes   Smokeless tobacco: Never  Substance and Sexual Activity   Alcohol use: Yes     Alcohol/week: 20.0 standard drinks of alcohol    Types: 20 Cans of beer per week   Drug use: No   Sexual activity: Not on file  Other Topics Concern   Not on file  Social History Narrative   Not on file   Social Determinants of Health   Financial Resource Strain: Not on file  Food Insecurity: Not on file  Transportation Needs: Not on file  Physical Activity: Not on file  Stress: Not on file  Social Connections: Not on file  Intimate Partner Violence: Not on file   Family Status  Relation Name Status   Mother  Deceased   Mat Aunt  Deceased   MGM GREAT Deceased   Father  Alive   Brother  Alive   Daughter  Alive   Son  Alive   Family History  Problem Relation Age of Onset   Breast cancer Mother        93   Pancreatic cancer Mother        35   Breast cancer Maternal Aunt        63   Breast cancer Maternal Grandmother        ?   Diabetes Father    No Known Allergies  Patient Care Team: Gwyneth Sprout, FNP as PCP - General (Family Medicine) Rico Junker, RN as Registered Nurse [place],  Alyssa Grove, RN (Inactive) as Registered Nurse [place] Ormond, Kathlene November, MD as Consulting Physician (Oncology) Robert Bellow, MD (General Surgery)   Medications: Outpatient Medications Prior to Visit  Medication Sig   meloxicam (MOBIC) 15 MG tablet Take 15 mg by mouth daily. (Patient not taking: Reported on 06/09/2022)   [DISCONTINUED] clonazePAM (KLONOPIN) 0.5 MG tablet Take 1 tablet (0.5 mg total) by mouth 2 (two) times daily as needed. for anxiety (Patient not taking: Reported on 06/09/2022)   No facility-administered medications prior to visit.    Review of Systems  Psychiatric/Behavioral:  Positive for sleep disturbance. The patient is nervous/anxious.     Objective     BP 128/84 (BP Location: Left Arm, Patient Position: Sitting, Cuff Size: Normal)   Pulse 95   Temp 98.5 F (36.9 C) (Oral)   Resp 16   Ht '5\' 5"'$  (1.651 m)   Wt 142 lb (64.4 kg)   LMP 11/24/1997 (Exact Date)    SpO2 98%   BMI 23.63 kg/m    Physical Exam Vitals and nursing note reviewed.  Constitutional:      General: She is awake. She is not in acute distress.    Appearance: Normal appearance. She is well-developed, well-groomed and normal weight. She is not ill-appearing, toxic-appearing or diaphoretic.  HENT:     Head: Normocephalic and atraumatic.     Jaw: There is normal jaw occlusion. No trismus, tenderness, swelling or pain on movement.     Right Ear: Hearing, tympanic membrane, ear canal and external ear normal. There is no impacted cerumen.     Left Ear: Hearing, tympanic membrane, ear canal and external ear normal. There is no impacted cerumen.     Nose: Nose normal. No congestion or rhinorrhea.     Right Turbinates: Not enlarged, swollen or pale.     Left Turbinates: Not enlarged, swollen or pale.     Right Sinus: No maxillary sinus tenderness or frontal sinus tenderness.     Left Sinus: No maxillary sinus tenderness or frontal sinus tenderness.     Mouth/Throat:     Lips: Pink.     Mouth: Mucous membranes are moist. No injury.     Tongue: No lesions.     Pharynx: Oropharynx is clear. Uvula midline. No pharyngeal swelling, oropharyngeal exudate, posterior oropharyngeal erythema or uvula swelling.     Tonsils: No tonsillar exudate or tonsillar abscesses.  Eyes:     General: Lids are normal. Lids are everted, no foreign bodies appreciated. Vision grossly intact. Gaze aligned appropriately. No allergic shiner or visual field deficit.       Right eye: No discharge.        Left eye: No discharge.     Extraocular Movements: Extraocular movements intact.     Conjunctiva/sclera: Conjunctivae normal.     Right eye: Right conjunctiva is not injected. No exudate.    Left eye: Left conjunctiva is not injected. No exudate.    Pupils: Pupils are equal, round, and reactive to light.  Neck:     Thyroid: No thyroid mass, thyromegaly or thyroid tenderness.     Vascular: No carotid bruit.      Trachea: Trachea normal.  Cardiovascular:     Rate and Rhythm: Normal rate and regular rhythm.     Pulses: Normal pulses.          Carotid pulses are 2+ on the right side and 2+ on the left side.      Radial pulses are 2+ on the right  side and 2+ on the left side.       Dorsalis pedis pulses are 2+ on the right side and 2+ on the left side.       Posterior tibial pulses are 2+ on the right side and 2+ on the left side.     Heart sounds: Normal heart sounds, S1 normal and S2 normal. No murmur heard.    No friction rub. No gallop.  Pulmonary:     Effort: Pulmonary effort is normal. No respiratory distress.     Breath sounds: Normal breath sounds and air entry. No stridor. No wheezing, rhonchi or rales.  Chest:     Chest wall: No tenderness.  Abdominal:     General: Abdomen is flat. Bowel sounds are normal. There is no distension.     Palpations: Abdomen is soft. There is no mass.     Tenderness: There is no abdominal tenderness. There is no right CVA tenderness, left CVA tenderness, guarding or rebound.     Hernia: No hernia is present.  Genitourinary:    Comments: Exam deferred; denies complaints Musculoskeletal:        General: No swelling, tenderness, deformity or signs of injury. Normal range of motion.     Cervical back: Full passive range of motion without pain, normal range of motion and neck supple. No edema, rigidity or tenderness. No muscular tenderness.     Right lower leg: No edema.     Left lower leg: No edema.  Lymphadenopathy:     Cervical: No cervical adenopathy.     Right cervical: No superficial, deep or posterior cervical adenopathy.    Left cervical: No superficial, deep or posterior cervical adenopathy.  Skin:    General: Skin is warm and dry.     Capillary Refill: Capillary refill takes less than 2 seconds.     Coloration: Skin is not jaundiced or pale.     Findings: No bruising, erythema, lesion or rash.  Neurological:     General: No focal deficit present.      Mental Status: She is alert and oriented to person, place, and time. Mental status is at baseline.     GCS: GCS eye subscore is 4. GCS verbal subscore is 5. GCS motor subscore is 6.     Sensory: Sensation is intact. No sensory deficit.     Motor: Motor function is intact. No weakness.     Coordination: Coordination is intact. Coordination normal.     Gait: Gait is intact. Gait normal.  Psychiatric:        Attention and Perception: Attention and perception normal.        Mood and Affect: Mood and affect normal.        Speech: Speech normal.        Behavior: Behavior normal. Behavior is cooperative.        Thought Content: Thought content normal.        Cognition and Memory: Cognition and memory normal.        Judgment: Judgment normal.     Problem List Items Addressed This Visit       Other   Annual physical exam - Primary    Due for vision and dental Things to do to keep yourself healthy  - Exercise at least 30-45 minutes a day, 3-4 days a week.  - Eat a low-fat diet with lots of fruits and vegetables, up to 7-9 servings per day.  - Seatbelts can save your life. Wear them always.  -  Smoke detectors on every level of your home, check batteries every year.  - Eye Doctor - have an eye exam every 1-2 years  - Safe sex - if you may be exposed to STDs, use a condom.  - Alcohol -  If you drink, do it moderately, less than 2 drinks per day.  - [place]. Choose someone to speak for you if you are not able.  - Depression is common in our stressful world.If you're feeling down or losing interest in things you normally enjoy, please come in for a visit.  - Violence - If anyone is threatening or hurting you, please call immediately.        Relevant Orders   Comprehensive Metabolic Panel (CMET)   CBC with Differential/Platelet   TSH + free T4   Lipid panel   Encounter for hepatitis C screening test for low risk patient    Low risk screen Treatable, and curable.  If left untreated Hep C can lead to cirrhosis and liver failure. Encourage routine testing; recommend repeat testing if risk factors change.       Relevant Orders   Hepatitis C Antibody   Encounter for screening for human immunodeficiency virus (HIV)    Low risk screen Consented; encouraged to "know your status" Recommend repeat screen if risk factors change       Relevant Orders   HIV antibody (with reflex)   Encounter for screening mammogram for malignant neoplasm of breast    Hx of breast cancer Due for screening for mammogram, denies breast concerns at this time, provided with phone number to call and schedule appointment for mammogram. Encouraged to repeat breast cancer screening every 1-2 years.       Relevant Orders   MM 3D SCREEN BREAST BILATERAL   Generalized anxiety disorder with panic attacks    Chronic, variable Daughter is an addict Son is in rehab Pt lives with her aging father Lost her job yesterday Has little support from brother PDMP reviewed       Relevant Medications   clonazePAM (KLONOPIN) 0.5 MG tablet   Need for influenza vaccination    Consented; VIS made available; no immediate side effects following administration; plan to repeat annually        Relevant Orders   Flu Vaccine QUAD 6+ mos PF IM (Fluarix Quad PF) (Completed)   Screening for colon cancer    Request for colo guard for completion of colon screening; low risk Denies constipation; reports daily BM      Relevant Orders   Cologuard   Screening for diabetes mellitus    Recommend A1c screening given previous elevation in blood glucose and polyuria       Relevant Orders   Hemoglobin A1c   Tobacco use disorder, continuous    Chronic, stable Not interesting in quitting at this time Has tried patches prior and failed Aware of 1800 Quit NOW        Last depression screening scores    06/09/2022    2:20 PM 08/24/2021    9:36 AM 01/13/2020    8:06 AM  PHQ 2/9 Scores  PHQ - 2  Score 0 0 0  PHQ- 9 Score 3 1    Last fall risk screening    06/09/2022    2:19 PM  Fall Risk   Falls in the past year? 1  Number falls in past yr: 1  Injury with Fall? 1   Last Audit-C alcohol use screening  06/09/2022    2:20 PM  Alcohol Use Disorder Test (AUDIT)  1. How often do you have a drink containing alcohol? 4  2. How many drinks containing alcohol do you have on a typical day when you are drinking? 1  3. How often do you have six or more drinks on one occasion? 1  AUDIT-C Score 6   A score of 3 or more in women, and 4 or more in men indicates increased risk for alcohol abuse, EXCEPT if all of the points are from question 1   No results found for any visits on 06/09/22.  Assessment & Plan    Routine Health Maintenance and Physical Exam  Exercise Activities and Dietary recommendations  Goals   None     Immunization History  Administered Date(s) Administered   Influenza,inj,Quad PF,6+ Mos 06/20/2018, 06/09/2022   Tdap 03/01/2017    Health Maintenance  Topic Date Due   COVID-19 Vaccine (1) Never done   Hepatitis C Screening  Never done   Zoster Vaccines- Shingrix (1 of 2) Never done   COLONOSCOPY (Pts 45-21yr Insurance coverage will need to be confirmed)  Never done   MAMMOGRAM  06/27/2020   TETANUS/TDAP  03/02/2027   INFLUENZA VACCINE  Completed   HIV Screening  Completed   HPV VACCINES  Aged Out    Discussed health benefits of physical activity, and encouraged her to engage in regular exercise appropriate for her age and condition.  Return in about 6 months (around 12/08/2022) for chonic disease management.     IVonna Kotyk FNP, have reviewed all documentation for this visit. The documentation on 06/09/22 for the exam, diagnosis, procedures, and orders are all accurate and complete.    EGwyneth Sprout FGrand [phone](phone) [phone](fax)  [place]

## 2022-06-09 NOTE — Patient Instructions (Signed)
Please call and schedule your mammogram:  [facility]  [address], [address] [facility] [place],  [place]  [address] Get Driving Directions Main: [phone]  Sunday:Closed Monday:7:20 AM - 5:00 PM Tuesday:7:20 AM - 5:00 PM Wednesday:7:20 AM - 5:00 PM Thursday:7:20 AM - 5:00 PM Friday:7:20 AM - 4:30 PM Saturday:Closed  

## 2022-06-09 NOTE — Assessment & Plan Note (Signed)
Hx of breast cancer Due for screening for mammogram, denies breast concerns at this time, provided with phone number to call and schedule appointment for mammogram. Encouraged to repeat breast cancer screening every 1-2 years.

## 2022-06-09 NOTE — Assessment & Plan Note (Signed)
Chronic, variable Daughter is an addict Son is in rehab Pt lives with her aging father Lost her job yesterday Has little support from brother PDMP reviewed

## 2022-06-09 NOTE — Assessment & Plan Note (Signed)
Low risk screen Treatable, and curable. If left untreated Hep C can lead to cirrhosis and liver failure. Encourage routine testing; recommend repeat testing if risk factors change.  

## 2022-06-09 NOTE — Assessment & Plan Note (Signed)
Consented; VIS made available; no immediate side effects following administration; plan to repeat annually   

## 2022-06-09 NOTE — Assessment & Plan Note (Signed)
Low risk screen ?Consented; encouraged to "know your status" ?Recommend repeat screen if risk factors change ? ?

## 2022-06-09 NOTE — Assessment & Plan Note (Signed)
Chronic, stable Not interesting in quitting at this time Has tried patches prior and failed Aware of 1800 Quit NOW

## 2022-06-09 NOTE — Assessment & Plan Note (Signed)
Due for vision and dental Things to do to keep yourself healthy  - Exercise at least 30-45 minutes a day, 3-4 days a week.  - Eat a low-fat diet with lots of fruits and vegetables, up to 7-9 servings per day.  - Seatbelts can save your life. Wear them always.  - Smoke detectors on every level of your home, check batteries every year.  - Eye Doctor - have an eye exam every 1-2 years  - Safe sex - if you may be exposed to STDs, use a condom.  - Alcohol -  If you drink, do it moderately, less than 2 drinks per day.  - [place]. Choose someone to speak for you if you are not able.  - Depression is common in our stressful world.If you're feeling down or losing interest in things you normally enjoy, please come in for a visit.  - Violence - If anyone is threatening or hurting you, please call immediately.

## 2022-06-09 NOTE — Assessment & Plan Note (Signed)
Request for colo guard for completion of colon screening; low risk Denies constipation; reports daily BM

## 2022-06-09 NOTE — Assessment & Plan Note (Signed)
Recommend A1c screening given previous elevation in blood glucose and polyuria

## 2022-06-10 LAB — CBC WITH DIFFERENTIAL/PLATELET
Basophils Absolute: 0 10*3/uL (ref 0.0–0.2)
Basos: 0 %
EOS (ABSOLUTE): 0.1 10*3/uL (ref 0.0–0.4)
Eos: 1 %
Hematocrit: 42 % (ref 34.0–46.6)
Hemoglobin: 15 g/dL (ref 11.1–15.9)
Immature Grans (Abs): 0 10*3/uL (ref 0.0–0.1)
Immature Granulocytes: 0 %
Lymphocytes Absolute: 2.2 10*3/uL (ref 0.7–3.1)
Lymphs: 23 %
MCH: 36.5 pg — ABNORMAL HIGH (ref 26.6–33.0)
MCHC: 35.7 g/dL (ref 31.5–35.7)
MCV: 102 fL — ABNORMAL HIGH (ref 79–97)
Monocytes Absolute: 0.7 10*3/uL (ref 0.1–0.9)
Monocytes: 7 %
Neutrophils Absolute: 6.6 10*3/uL (ref 1.4–7.0)
Neutrophils: 69 %
Platelets: 264 10*3/uL (ref 150–450)
RBC: 4.11 x10E6/uL (ref 3.77–5.28)
RDW: 11.6 % — ABNORMAL LOW (ref 11.7–15.4)
WBC: 9.7 10*3/uL (ref 3.4–10.8)

## 2022-06-10 LAB — COMPREHENSIVE METABOLIC PANEL
ALT: 24 IU/L (ref 0–32)
AST: 25 IU/L (ref 0–40)
Albumin/Globulin Ratio: 2.1 (ref 1.2–2.2)
Albumin: 4.8 g/dL (ref 3.8–4.9)
Alkaline Phosphatase: 91 IU/L (ref 44–121)
BUN/Creatinine Ratio: 8 — ABNORMAL LOW (ref 9–23)
BUN: 4 mg/dL — ABNORMAL LOW (ref 6–24)
Bilirubin Total: 0.6 mg/dL (ref 0.0–1.2)
CO2: 21 mmol/L (ref 20–29)
Calcium: 9.6 mg/dL (ref 8.7–10.2)
Chloride: 99 mmol/L (ref 96–106)
Creatinine, Ser: 0.53 mg/dL — ABNORMAL LOW (ref 0.57–1.00)
Globulin, Total: 2.3 g/dL (ref 1.5–4.5)
Glucose: 90 mg/dL (ref 70–99)
Potassium: 4.1 mmol/L (ref 3.5–5.2)
Sodium: 138 mmol/L (ref 134–144)
Total Protein: 7.1 g/dL (ref 6.0–8.5)
eGFR: 108 mL/min/{1.73_m2} (ref >59)

## 2022-06-10 LAB — TSH+FREE T4
Free T4: 1.39 ng/dL (ref 0.82–1.77)
TSH: 2.27 u[IU]/mL (ref 0.450–4.500)

## 2022-06-10 LAB — LIPID PANEL
Chol/HDL Ratio: 2.2 ratio (ref 0.0–4.4)
Cholesterol, Total: 229 mg/dL — ABNORMAL HIGH (ref 100–199)
HDL: 104 mg/dL (ref >39)
LDL Chol Calc (NIH): 117 mg/dL — ABNORMAL HIGH (ref 0–99)
Triglycerides: 47 mg/dL (ref 0–149)
VLDL Cholesterol Cal: 8 mg/dL (ref 5–40)

## 2022-06-10 LAB — HEMOGLOBIN A1C
Est. average glucose Bld gHb Est-mCnc: 103 mg/dL
Hgb A1c MFr Bld: 5.2 % (ref 4.8–5.6)

## 2022-06-10 LAB — HIV ANTIBODY (ROUTINE TESTING W REFLEX): HIV Screen 4th Generation wRfx: NONREACTIVE

## 2022-06-10 LAB — HEPATITIS C ANTIBODY: Hep C Virus Ab: NONREACTIVE

## 2022-06-10 NOTE — Progress Notes (Signed)
Cholesterol remains elevated with LDL of 117 and total of 229. I recommend diet low in saturated fat and regular exercise - 30 min at least 5 times per week Continue to recommend smoking cessation to decrease risk of heart attack and stroke.  All other labs are normal and stable.  Gwyneth Sprout, [address] [place], [place] [address] [phone] (phone) [phone] (fax) [place]

## 2022-06-22 ENCOUNTER — Other Ambulatory Visit: Payer: Self-pay | Admitting: Family Medicine

## 2022-06-22 ENCOUNTER — Ambulatory Visit
Admission: RE | Admit: 2022-06-22 | Discharge: 2022-06-22 | Disposition: A | Discharge status: Home / Self Care | Payer: No Typology Code available for payment source | Source: Ambulatory Visit | Attending: Family Medicine | Admitting: Family Medicine

## 2022-06-22 DIAGNOSIS — Z1231 Encounter for screening mammogram for malignant neoplasm of breast: Secondary | ICD-10-CM | POA: Diagnosis present

## 2022-06-23 NOTE — Progress Notes (Signed)
Hi Nevaeha  Normal mammogram; repeat in 1 year.  Please let us know if you have any questions.  Thank you,  Tally Joe, FNP

## 2023-08-08 ENCOUNTER — Encounter: Payer: Self-pay | Admitting: Family Medicine

## 2023-08-08 ENCOUNTER — Other Ambulatory Visit: Payer: Self-pay | Admitting: Family Medicine

## 2023-08-08 DIAGNOSIS — Z1231 Encounter for screening mammogram for malignant neoplasm of breast: Secondary | ICD-10-CM

## 2023-08-15 ENCOUNTER — Encounter: Payer: Self-pay | Admitting: Family Medicine

## 2023-08-15 ENCOUNTER — Ambulatory Visit (INDEPENDENT_AMBULATORY_CARE_PROVIDER_SITE_OTHER): Payer: BC Managed Care – PPO | Admitting: Family Medicine

## 2023-08-15 ENCOUNTER — Ambulatory Visit
Admission: RE | Admit: 2023-08-15 | Discharge: 2023-08-15 | Disposition: A | Discharge status: Home / Self Care | Payer: BC Managed Care – PPO | Source: Ambulatory Visit | Attending: Family Medicine | Admitting: Family Medicine

## 2023-08-15 VITALS — BP 120/78 | HR 80 | Ht 65.0 in | Wt 148.0 lb

## 2023-08-15 DIAGNOSIS — F410 Panic disorder [episodic paroxysmal anxiety] without agoraphobia: Secondary | ICD-10-CM

## 2023-08-15 DIAGNOSIS — R739 Hyperglycemia, unspecified: Secondary | ICD-10-CM | POA: Diagnosis not present

## 2023-08-15 DIAGNOSIS — Z1211 Encounter for screening for malignant neoplasm of colon: Secondary | ICD-10-CM | POA: Insufficient documentation

## 2023-08-15 DIAGNOSIS — Z23 Encounter for immunization: Secondary | ICD-10-CM | POA: Insufficient documentation

## 2023-08-15 DIAGNOSIS — Z0000 Encounter for general adult medical examination without abnormal findings: Secondary | ICD-10-CM

## 2023-08-15 DIAGNOSIS — Z1231 Encounter for screening mammogram for malignant neoplasm of breast: Secondary | ICD-10-CM | POA: Insufficient documentation

## 2023-08-15 DIAGNOSIS — F17209 Nicotine dependence, unspecified, with unspecified nicotine-induced disorders: Secondary | ICD-10-CM | POA: Diagnosis not present

## 2023-08-15 DIAGNOSIS — Z0001 Encounter for general adult medical examination with abnormal findings: Secondary | ICD-10-CM | POA: Diagnosis not present

## 2023-08-15 DIAGNOSIS — F411 Generalized anxiety disorder: Secondary | ICD-10-CM | POA: Diagnosis not present

## 2023-08-15 DIAGNOSIS — Z780 Asymptomatic menopausal state: Secondary | ICD-10-CM | POA: Diagnosis not present

## 2023-08-15 DIAGNOSIS — L989 Disorder of the skin and subcutaneous tissue, unspecified: Secondary | ICD-10-CM

## 2023-08-15 DIAGNOSIS — Z122 Encounter for screening for malignant neoplasm of respiratory organs: Secondary | ICD-10-CM | POA: Insufficient documentation

## 2023-08-15 DIAGNOSIS — N9089 Other specified noninflammatory disorders of vulva and perineum: Secondary | ICD-10-CM

## 2023-08-15 MED ORDER — CLONAZEPAM 0.5 MG PO TABS: 0.5000 mg | ORAL_TABLET | Freq: Every day | ORAL | 0 refills | Status: DC | PRN

## 2023-08-15 NOTE — Assessment & Plan Note (Signed)
Chronic; improved Wishes to continue low dose benzo to assist Pt is aware of risks of psychoactive medication use to include increased sedation, respiratory suppression, falls, extrapyramidal movements,  dependence and cardiovascular events.  Pt would like to continue treatment as benefit determined to outweigh risk.   PDMP reviewed

## 2023-08-15 NOTE — Progress Notes (Signed)
Complete physical exam   Patient: Abigail Reeves   DOB: 1966/08/30   57 y.o. Female  MRN: [id number] Visit Date: 08/15/2023  Today's healthcare provider: Jacky Kindle, FNP  Re Introduced to nurse practitioner role and practice setting.  All questions answered.  Discussed provider/patient relationship and expectations.  Chief Complaint  Patient presents with   Annual Exam   Subjective    Abigail Reeves is a 57 y.o. female who presents today for a complete physical exam.  She reports consuming a general diet. The patient does not participate in regular exercise at present. She generally feels fairly well. She reports sleeping fairly well. She does have additional problems to discuss today.  HPI   Reports skin tag to R labial folds x months.  Daughter is on drugs.  Dad is aging and concerns for possible dementia.  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   BREAST EXCISIONAL BIOPSY Right 02/2016   mastecomy   ELBOW SURGERY Left    MASTECTOMY Right 2017   chemo mastecty with reconstruction   Social History   Socioeconomic History   Marital status: Single    Spouse name: Not on file   Number of children: 2   Years of education: college   Highest education level: Not on file  Occupational History    Employer: [employer]  Tobacco Use   Smoking status: Every Day    Current packs/day: 1.00    Average packs/day: 1 pack/day for 25.0 years (25.0 ttl pk-yrs)    Types: Cigarettes   Smokeless tobacco: Never  Substance and Sexual Activity   Alcohol use: Yes    Alcohol/week: 20.0 standard drinks of alcohol    Types: 20 Cans of beer per week   Drug use: No   Sexual activity:  Not on file  Other Topics Concern   Not on file  Social History Narrative   Not on file   Social Determinants of Health   Financial Resource Strain: Not on file  Food Insecurity: Not on file  Transportation Needs: Not on file  Physical Activity: Not on file  Stress: Not on file  Social Connections: Not on file  Intimate Partner Violence: Not on file   Family Status  Relation Name Status   Mother  Deceased   Mat Aunt  Deceased   MGM GREAT Deceased   Father  Alive   Brother  Alive   Daughter  Alive   Son  Alive  No partnership data on file   Family History  Problem Relation Age of Onset   Breast cancer Mother        41   Pancreatic cancer Mother        92   Breast cancer Maternal Aunt        63   Breast cancer Maternal Grandmother        ?   Diabetes Father    No Known Allergies  Patient Care Team: Jacky Kindle, FNP as PCP - General (Family Medicine) Jim Like, RN as Registered Nurse Scarlett Presto, RN (Inactive) as Registered Nurse Jeralyn Ruths, MD as Consulting Physician (Oncology) Donnalee Curry  W, MD (General Surgery)   Medications: Outpatient Medications Prior to Visit  Medication Sig   albuterol (VENTOLIN HFA) 108 (90 Base) MCG/ACT inhaler Inhale 1-2 puffs into the lungs every 6 (six) hours as needed for wheezing or shortness of breath.   [DISCONTINUED] clonazePAM (KLONOPIN) 0.5 MG tablet Take 1 tablet (0.5 mg total) by mouth daily as needed.   [DISCONTINUED] meloxicam (MOBIC) 15 MG tablet Take 15 mg by mouth daily. (Patient not taking: Reported on 06/09/2022)   No facility-administered medications prior to visit.    Review of Systems   Objective    BP 120/78   Pulse 80   Ht 5\' 5"  (1.651 m)   Wt 148 lb (67.1 kg)   LMP 11/24/1997 (Exact Date)   SpO2 97%   BMI 24.63 kg/m    Physical Exam Vitals and nursing note reviewed.  Constitutional:      General: She is awake. She is not in acute distress.    Appearance: Normal  appearance. She is well-developed, well-groomed and normal weight. She is not ill-appearing, toxic-appearing or diaphoretic.  HENT:     Head: Normocephalic and atraumatic.     Jaw: There is normal jaw occlusion. No trismus, tenderness, swelling or pain on movement.      Right Ear: Hearing, tympanic membrane, ear canal and external ear normal. There is no impacted cerumen.     Left Ear: Hearing, tympanic membrane, ear canal and external ear normal. There is no impacted cerumen.     Nose: Nose normal. No congestion or rhinorrhea.     Right Turbinates: Not enlarged, swollen or pale.     Left Turbinates: Not enlarged, swollen or pale.     Right Sinus: No maxillary sinus tenderness or frontal sinus tenderness.     Left Sinus: No maxillary sinus tenderness or frontal sinus tenderness.     Mouth/Throat:     Lips: Pink.     Mouth: Mucous membranes are moist. No injury.     Tongue: No lesions.     Pharynx: Oropharynx is clear. Uvula midline. No pharyngeal swelling, oropharyngeal exudate, posterior oropharyngeal erythema or uvula swelling.     Tonsils: No tonsillar exudate or tonsillar abscesses.  Eyes:     General: Lids are normal. Lids are everted, no foreign bodies appreciated. Vision grossly intact. Gaze aligned appropriately. No allergic shiner or visual field deficit.       Right eye: No discharge.        Left eye: No discharge.     Extraocular Movements: Extraocular movements intact.     Conjunctiva/sclera: Conjunctivae normal.     Right eye: Right conjunctiva is not injected. No exudate.    Left eye: Left conjunctiva is not injected. No exudate.    Pupils: Pupils are equal, round, and reactive to light.  Neck:     Thyroid: No thyroid mass, thyromegaly or thyroid tenderness.     Vascular: No carotid bruit.     Trachea: Trachea normal.  Cardiovascular:     Rate and Rhythm: Normal rate and regular rhythm.     Pulses: Normal pulses.          Carotid pulses are 2+ on the right side and 2+  on the left side.      Radial pulses are 2+ on the right side and 2+ on the left side.       Dorsalis pedis pulses are 2+ on the right side and 2+ on the left side.  Posterior tibial pulses are 2+ on the right side and 2+ on the left side.     Heart sounds: Normal heart sounds, S1 normal and S2 normal. No murmur heard.    No friction rub. No gallop.  Pulmonary:     Effort: Pulmonary effort is normal. No respiratory distress.     Breath sounds: Normal breath sounds and air entry. No stridor. No wheezing, rhonchi or rales.  Chest:     Chest wall: No tenderness.  Abdominal:     General: Abdomen is flat. Bowel sounds are normal. There is no distension.     Palpations: Abdomen is soft. There is no mass.     Tenderness: There is no abdominal tenderness. There is no right CVA tenderness, left CVA tenderness, guarding or rebound.     Hernia: No hernia is present.  Genitourinary:      Comments: Exam deferred; denies complaints Musculoskeletal:        General: No swelling, tenderness, deformity or signs of injury. Normal range of motion.     Cervical back: Full passive range of motion without pain, normal range of motion and neck supple. No edema, rigidity or tenderness. No muscular tenderness.     Right lower leg: No edema.     Left lower leg: No edema.  Lymphadenopathy:     Cervical: No cervical adenopathy.     Right cervical: No superficial, deep or posterior cervical adenopathy.    Left cervical: No superficial, deep or posterior cervical adenopathy.  Skin:    General: Skin is warm and dry.     Capillary Refill: Capillary refill takes less than 2 seconds.     Coloration: Skin is not jaundiced or pale.     Findings: No bruising, erythema, lesion or rash.       Neurological:     General: No focal deficit present.     Mental Status: She is alert and oriented to person, place, and time. Mental status is at baseline.     GCS: GCS eye subscore is 4. GCS verbal subscore is 5. GCS motor  subscore is 6.     Sensory: Sensation is intact. No sensory deficit.     Motor: Motor function is intact. No weakness.     Coordination: Coordination is intact. Coordination normal.     Gait: Gait is intact. Gait normal.  Psychiatric:        Attention and Perception: Attention and perception normal.        Mood and Affect: Mood and affect normal.        Speech: Speech normal.        Behavior: Behavior normal. Behavior is cooperative.        Thought Content: Thought content normal.        Cognition and Memory: Cognition and memory normal.        Judgment: Judgment normal.      Last depression screening scores    08/15/2023    1:47 PM 06/09/2022    2:20 PM 08/24/2021    9:36 AM  PHQ 2/9 Scores  PHQ - 2 Score 1 0 0  PHQ- 9 Score 5 3 1    Last fall risk screening    08/15/2023    1:47 PM  Fall Risk   Falls in the past year? 1  Number falls in past yr: 1  Injury with Fall? 0   Last Audit-C alcohol use screening    06/09/2022    2:20 PM  Alcohol Use Disorder  Test (AUDIT)  1. How often do you have a drink containing alcohol? 4  2. How many drinks containing alcohol do you have on a typical day when you are drinking? 1  3. How often do you have six or more drinks on one occasion? 1  AUDIT-C Score 6   A score of 3 or more in women, and 4 or more in men indicates increased risk for alcohol abuse, EXCEPT if all of the points are from question 1   No results found for any visits on 08/15/23.  Assessment & Plan    Routine Health Maintenance and Physical Exam  Exercise Activities and Dietary recommendations  Goals   None     Immunization History  Administered Date(s) Administered   Influenza, Seasonal, Injecte, Preservative Fre 08/15/2023   Influenza,inj,Quad PF,6+ Mos 06/18/2015, 06/20/2018, 06/09/2022   Tdap 06/07/2015, 03/01/2017    Health Maintenance  Topic Date Due   COVID-19 Vaccine (1) Never done   Zoster Vaccines- Shingrix (1 of 2) Never done   Colonoscopy   Never done   Lung Cancer Screening  Never done   MAMMOGRAM  06/22/2024   DTaP/Tdap/Td (3 - Td or Tdap) 03/02/2027   INFLUENZA VACCINE  Completed   Hepatitis C Screening  Completed   HIV Screening  Completed   HPV VACCINES  Aged Out    Discussed health benefits of physical activity, and encouraged her to engage in regular exercise appropriate for her age and condition.  Problem List Items Addressed This Visit       Musculoskeletal and Integument   Benign skin lesion of cheek    Area of dryness to R cheek; reports non itching, non bleeding Recommend cx with derm      Relevant Orders   Ambulatory referral to Dermatology   Benign skin lesion of multiple sites    L flank; appears like atypical derm/eczema Recommend cx with derm       Relevant Orders   Ambulatory referral to Dermatology   Labial skin tag    Large pea shaped; raised/loose skin tag Encourage cx with derm Unclear if derm vs gyn should refer for removal; will start with derm given hx of other skin changes      Relevant Orders   Ambulatory referral to Dermatology     Other   Annual physical exam - Primary    Things to do to keep yourself healthy  - Exercise at least 30-45 minutes a day, 3-4 days a week.  - Eat a low-fat diet with lots of fruits and vegetables, up to 7-9 servings per day.  - Seatbelts can save your life. Wear them always.  - Smoke detectors on every level of your home, check batteries every year.  - Eye Doctor - have an eye exam every 1-2 years  - Safe sex - if you may be exposed to STDs, use a condom.  - Alcohol -  If you drink, do it moderately, less than 2 drinks per day.  - Health Care Power of Attorney. Choose someone to speak for you if you are not able.  - Depression is common in our stressful world.If you're feeling down or losing interest in things you normally enjoy, please come in for a visit.  - Violence - If anyone is threatening or hurting you, please call immediately.        Relevant Orders   CBC with Differential/Platelet   Comprehensive Metabolic Panel (CMET)   TSH   Lipid panel  Ambulatory Referral Lung Cancer Screening [facility]   Ambulatory referral to Gastroenterology   Elevated serum glucose    Check A1c; Continue to recommend balanced, lower carb meals. Smaller meal size, adding snacks. Choosing water as drink of choice and increasing purposeful exercise.       Relevant Orders   Hemoglobin A1c   Encounter for immunization   Relevant Orders   Flu vaccine trivalent PF, 6mos and older(Flulaval,Afluria,Fluarix,Fluzone) (Completed)   Generalized anxiety disorder with panic attacks    Chronic; improved Wishes to continue low dose benzo to assist Pt is aware of risks of psychoactive medication use to include increased sedation, respiratory suppression, falls, extrapyramidal movements,  dependence and cardiovascular events.  Pt would like to continue treatment as benefit determined to outweigh risk.   PDMP reviewed      Relevant Medications   clonazePAM (KLONOPIN) 0.5 MG tablet   Post-menopausal    Recommend Vit D testing given post menopausal state      Relevant Orders   Vitamin D (25 hydroxy)   Screen for colon cancer    Agreeable to colon cancer screening      Relevant Orders   Ambulatory referral to Gastroenterology   Screening for lung cancer    Referral to pulm for LDLCT      Relevant Orders   Ambulatory Referral Lung Cancer Screening [facility]   Tobacco use disorder, continuous    Chronic; remains precontemplative Continue to monitor      No follow-ups on file.    Leilani Merl, FNP, have reviewed all documentation for this visit. The documentation on 08/15/23 for the exam, diagnosis, procedures, and orders are all accurate and complete.  Jacky Kindle, FNP  [facility] [phone] (phone) [phone] (fax)  [facility]

## 2023-08-15 NOTE — Assessment & Plan Note (Signed)
 Agreeable to colon cancer screening

## 2023-08-15 NOTE — Assessment & Plan Note (Signed)
Referral to pulm for LDLCT

## 2023-08-15 NOTE — Assessment & Plan Note (Signed)
Chronic; remains precontemplative Continue to monitor

## 2023-08-15 NOTE — Assessment & Plan Note (Signed)
Area of dryness to R cheek; reports non itching, non bleeding Recommend cx with derm

## 2023-08-15 NOTE — Assessment & Plan Note (Signed)
Large pea shaped; raised/loose skin tag Encourage cx with derm Unclear if derm vs gyn should refer for removal; will start with derm given hx of other skin changes

## 2023-08-15 NOTE — Assessment & Plan Note (Signed)

## 2023-08-15 NOTE — Assessment & Plan Note (Signed)
Check A1c Continue to recommend balanced, lower carb meals. Smaller meal size, adding snacks. Choosing water as drink of choice and increasing purposeful exercise.

## 2023-08-15 NOTE — Assessment & Plan Note (Signed)
Recommend Vit D testing given post menopausal state

## 2023-08-15 NOTE — Assessment & Plan Note (Signed)
L flank; appears like atypical derm/eczema Recommend cx with derm

## 2023-08-16 ENCOUNTER — Other Ambulatory Visit: Payer: Self-pay

## 2023-08-16 ENCOUNTER — Telehealth: Payer: Self-pay

## 2023-08-16 DIAGNOSIS — Z1211 Encounter for screening for malignant neoplasm of colon: Secondary | ICD-10-CM

## 2023-08-16 LAB — COMPREHENSIVE METABOLIC PANEL
ALT: 30 [IU]/L (ref 0–32)
AST: 29 [IU]/L (ref 0–40)
Albumin: 4.7 g/dL (ref 3.8–4.9)
Alkaline Phosphatase: 78 [IU]/L (ref 44–121)
BUN/Creatinine Ratio: 9 (ref 9–23)
BUN: 5 mg/dL — ABNORMAL LOW (ref 6–24)
Bilirubin Total: 0.4 mg/dL (ref 0.0–1.2)
CO2: 22 mmol/L (ref 20–29)
Calcium: 9.5 mg/dL (ref 8.7–10.2)
Chloride: 99 mmol/L (ref 96–106)
Creatinine, Ser: 0.53 mg/dL — ABNORMAL LOW (ref 0.57–1.00)
Globulin, Total: 2.3 g/dL (ref 1.5–4.5)
Glucose: 91 mg/dL (ref 70–99)
Potassium: 4.3 mmol/L (ref 3.5–5.2)
Sodium: 137 mmol/L (ref 134–144)
Total Protein: 7 g/dL (ref 6.0–8.5)
eGFR: 108 mL/min/{1.73_m2} (ref >59)

## 2023-08-16 LAB — LIPID PANEL
Chol/HDL Ratio: 2.5 ratio (ref 0.0–4.4)
Cholesterol, Total: 257 mg/dL — ABNORMAL HIGH (ref 100–199)
HDL: 104 mg/dL (ref >39)
LDL Chol Calc (NIH): 137 mg/dL — ABNORMAL HIGH (ref 0–99)
Triglycerides: 96 mg/dL (ref 0–149)
VLDL Cholesterol Cal: 16 mg/dL (ref 5–40)

## 2023-08-16 LAB — CBC WITH DIFFERENTIAL/PLATELET
Basophils Absolute: 0.1 10*3/uL (ref 0.0–0.2)
Basos: 1 %
EOS (ABSOLUTE): 0.1 10*3/uL (ref 0.0–0.4)
Eos: 1 %
Hematocrit: 42.4 % (ref 34.0–46.6)
Hemoglobin: 15 g/dL (ref 11.1–15.9)
Immature Grans (Abs): 0 10*3/uL (ref 0.0–0.1)
Immature Granulocytes: 0 %
Lymphocytes Absolute: 2.6 10*3/uL (ref 0.7–3.1)
Lymphs: 29 %
MCH: 36.9 pg — ABNORMAL HIGH (ref 26.6–33.0)
MCHC: 35.4 g/dL (ref 31.5–35.7)
MCV: 104 fL — ABNORMAL HIGH (ref 79–97)
Monocytes Absolute: 0.8 10*3/uL (ref 0.1–0.9)
Monocytes: 8 %
Neutrophils Absolute: 5.6 10*3/uL (ref 1.4–7.0)
Neutrophils: 61 %
Platelets: 276 10*3/uL (ref 150–450)
RBC: 4.07 x10E6/uL (ref 3.77–5.28)
RDW: 11.6 % — ABNORMAL LOW (ref 11.7–15.4)
WBC: 9.2 10*3/uL (ref 3.4–10.8)

## 2023-08-16 LAB — HEMOGLOBIN A1C
Est. average glucose Bld gHb Est-mCnc: 103 mg/dL
Hgb A1c MFr Bld: 5.2 % (ref 4.8–5.6)

## 2023-08-16 LAB — TSH: TSH: 3.24 u[IU]/mL (ref 0.450–4.500)

## 2023-08-16 LAB — VITAMIN D 25 HYDROXY (VIT D DEFICIENCY, FRACTURES): Vit D, 25-Hydroxy: 24.1 ng/mL — ABNORMAL LOW (ref 30.0–100.0)

## 2023-08-16 NOTE — Telephone Encounter (Signed)
Gastroenterology Pre-Procedure Review  Request Date: 10/13/23 Requesting Physician: Dr. Allegra Lai  PATIENT REVIEW QUESTIONS: The patient responded to the following health history questions as indicated:    1. Are you having any GI issues? no 2. Do you have a personal history of Polyps? no 3. Do you have a family history of Colon Cancer or Polyps? yes (FATHER COLON POLYPS) 4. Diabetes Mellitus? no 5. Joint replacements in the past 12 months?no 6. Major health problems in the past 3 months?no 7. Any artificial heart valves, MVP, or defibrillator?no    MEDICATIONS & ALLERGIES:    Patient reports the following regarding taking any anticoagulation/antiplatelet therapy:   Plavix, Coumadin, Eliquis, Xarelto, Lovenox, Pradaxa, Brilinta, or Effient? no Aspirin? no  Patient confirms/reports the following medications:  Current Outpatient Medications  Medication Sig Dispense Refill   albuterol (VENTOLIN HFA) 108 (90 Base) MCG/ACT inhaler Inhale 1-2 puffs into the lungs every 6 (six) hours as needed for wheezing or shortness of breath.     clonazePAM (KLONOPIN) 0.5 MG tablet Take 1 tablet (0.5 mg total) by mouth daily as needed. 90 tablet 0   No current facility-administered medications for this visit.    Patient confirms/reports the following allergies:  No Known Allergies  No orders of the defined types were placed in this encounter.   AUTHORIZATION INFORMATION Primary Insurance: 1D#: Group #:  Secondary Insurance: 1D#: Group #:  SCHEDULE INFORMATION: Date: 10/13/23 Time: Location: [facility]

## 2023-08-16 NOTE — Progress Notes (Signed)
Labs remain stable; cholesterol is elevated both total and bad/LDL. I continue to recommend diet low in saturated fat and regular exercise - 30 min at least 5 times per week in addition to smoking cessation.  [place] [address] Vit D3 once weekly or Vit D3 5000 IU OTC

## 2023-09-25 ENCOUNTER — Telehealth: Payer: Self-pay

## 2023-09-25 MED ORDER — NA SULFATE-K SULFATE-MG SULF 17.5-3.13-1.6 GM/177ML PO SOLN: 1.0000 | Freq: Once | ORAL | 0 refills | Status: AC

## 2023-09-25 NOTE — Telephone Encounter (Signed)
Call returned to patient.  Colonoscopy has been rescheduled from 10/13/23 to 11/10/23 still with Dr. Allegra Lai at [facility].  Vikkin in Endo notified.  Referral updated.  Instructions updated.  Thanks,  [place], [place]

## 2023-09-25 NOTE — Addendum Note (Signed)
Addended by: Avie Arenas on: 09/25/2023 12:39 PM   Modules accepted: Orders

## 2023-09-25 NOTE — Telephone Encounter (Signed)
Pt called to reschedule procedure to a later date

## 2023-10-16 ENCOUNTER — Telehealth: Payer: Self-pay

## 2023-10-16 NOTE — Telephone Encounter (Signed)
 Per pt omn 09-25-2023 may have new insurance 10-04-2023. Pt has not call back to let me know about insurance.  I have call pt 6 times .

## 2023-10-16 NOTE — Telephone Encounter (Signed)
 Pt return call with insurance information

## 2023-10-31 ENCOUNTER — Telehealth: Payer: Self-pay

## 2023-10-31 NOTE — Telephone Encounter (Signed)
The patient called in to get the number for ENDO because she wants her appointment in the morning. I inform her that she will have to call ENDO to discuss her time.

## 2023-11-03 ENCOUNTER — Encounter: Payer: Self-pay | Admitting: Gastroenterology

## 2023-11-10 ENCOUNTER — Ambulatory Visit: Payer: BC Managed Care – PPO | Admitting: General Practice

## 2023-11-10 ENCOUNTER — Encounter: Payer: Self-pay | Admitting: Gastroenterology

## 2023-11-10 ENCOUNTER — Ambulatory Visit
Admission: RE | Admit: 2023-11-10 | Discharge: 2023-11-10 | Disposition: A | Discharge status: Home / Self Care | Payer: BC Managed Care – PPO | Attending: Gastroenterology | Admitting: Gastroenterology

## 2023-11-10 ENCOUNTER — Encounter
Admission: RE | Disposition: A | Discharge status: Home / Self Care | Payer: Self-pay | Source: Home / Self Care | Attending: Gastroenterology

## 2023-11-10 DIAGNOSIS — K621 Rectal polyp: Secondary | ICD-10-CM

## 2023-11-10 DIAGNOSIS — F17290 Nicotine dependence, other tobacco product, uncomplicated: Secondary | ICD-10-CM | POA: Insufficient documentation

## 2023-11-10 DIAGNOSIS — D128 Benign neoplasm of rectum: Secondary | ICD-10-CM | POA: Diagnosis not present

## 2023-11-10 DIAGNOSIS — Z1211 Encounter for screening for malignant neoplasm of colon: Secondary | ICD-10-CM | POA: Insufficient documentation

## 2023-11-10 DIAGNOSIS — K644 Residual hemorrhoidal skin tags: Secondary | ICD-10-CM | POA: Diagnosis not present

## 2023-11-10 DIAGNOSIS — D120 Benign neoplasm of cecum: Secondary | ICD-10-CM | POA: Diagnosis not present

## 2023-11-10 DIAGNOSIS — K5730 Diverticulosis of large intestine without perforation or abscess without bleeding: Secondary | ICD-10-CM | POA: Diagnosis not present

## 2023-11-10 DIAGNOSIS — D123 Benign neoplasm of transverse colon: Secondary | ICD-10-CM

## 2023-11-10 DIAGNOSIS — D124 Benign neoplasm of descending colon: Secondary | ICD-10-CM | POA: Insufficient documentation

## 2023-11-10 DIAGNOSIS — D125 Benign neoplasm of sigmoid colon: Secondary | ICD-10-CM | POA: Insufficient documentation

## 2023-11-10 DIAGNOSIS — F17210 Nicotine dependence, cigarettes, uncomplicated: Secondary | ICD-10-CM | POA: Insufficient documentation

## 2023-11-10 HISTORY — PX: HEMOSTASIS CLIP PLACEMENT: SHX6857

## 2023-11-10 HISTORY — PX: POLYPECTOMY: SHX5525

## 2023-11-10 HISTORY — PX: COLONOSCOPY WITH PROPOFOL: SHX5780

## 2023-11-10 SURGERY — COLONOSCOPY WITH PROPOFOL
Anesthesia: General

## 2023-11-10 MED ORDER — DEXMEDETOMIDINE HCL IN NACL 80 MCG/20ML IV SOLN
INTRAVENOUS | Status: DC | PRN
  Administered 2023-11-10 (×2): 12 ug via INTRAVENOUS

## 2023-11-10 MED ORDER — PROPOFOL 500 MG/50ML IV EMUL
INTRAVENOUS | Status: DC | PRN
  Administered 2023-11-10: 150 ug/kg/min via INTRAVENOUS

## 2023-11-10 MED ORDER — SODIUM CHLORIDE 0.9 % IV SOLN
INTRAVENOUS | Status: DC
  Administered 2023-11-10: 500 mL via INTRAVENOUS

## 2023-11-10 MED ORDER — LIDOCAINE HCL (CARDIAC) PF 100 MG/5ML IV SOSY
PREFILLED_SYRINGE | INTRAVENOUS | Status: DC | PRN
Start: 2023-11-10 — End: 2023-11-10
  Administered 2023-11-10: 100 mg via INTRAVENOUS

## 2023-11-10 MED ORDER — PHENYLEPHRINE 80 MCG/ML (10ML) SYRINGE FOR IV PUSH (FOR BLOOD PRESSURE SUPPORT)
PREFILLED_SYRINGE | INTRAVENOUS | Status: DC | PRN
  Administered 2023-11-10: 80 ug via INTRAVENOUS

## 2023-11-10 MED ORDER — ESMOLOL HCL 100 MG/10ML IV SOLN
INTRAVENOUS | Status: DC | PRN
  Administered 2023-11-10: 20 mg via INTRAVENOUS

## 2023-11-10 NOTE — Op Note (Signed)
 [facility] Gastroenterology Patient Name: Abigail Reeves Procedure Date: 11/10/2023 9:06 AM MRN: [id number] Account #: [id number] Date of Birth: 1966-05-14 Admit Type: Outpatient Age: 58 Room: [facility] ENDO ROOM 2 Gender: Female Note Status: Finalized Instrument Name: Peds Colonoscope [id number] Procedure:             Colonoscopy Indications:           Screening for colorectal malignant neoplasm, This is                         the patient's first colonoscopy Providers:             Corinn Jess Brooklyn MD, MD Referring MD:          Kelly DASEN. Emilio (Referring MD) Medicines:             General Anesthesia Complications:         No immediate complications. Estimated blood loss: None. Procedure:             Pre-Anesthesia Assessment:                        - Prior to the procedure, a History and Physical was                         performed, and patient medications and allergies were                         reviewed. The patient is competent. The risks and                         benefits of the procedure and the sedation options and                         risks were discussed with the patient. All questions                         were answered and informed consent was obtained.                         Patient identification and proposed procedure were                         verified by the physician, the nurse, the                         anesthesiologist, the anesthetist and the technician                         in the pre-procedure area in the procedure room in the                         endoscopy suite. Mental Status Examination: alert and                         oriented. Airway Examination: normal oropharyngeal                         airway and neck mobility. Respiratory Examination:  clear to auscultation. CV Examination: normal.                         Prophylactic Antibiotics: The patient does not require                          prophylactic antibiotics. Prior Anticoagulants: The                         patient has taken no anticoagulant or antiplatelet                         agents. ASA Grade Assessment: II - A patient with mild                         systemic disease. After reviewing the risks and                         benefits, the patient was deemed in satisfactory                         condition to undergo the procedure. The anesthesia                         plan was to use general anesthesia. Immediately prior                         to administration of medications, the patient was                         re-assessed for adequacy to receive sedatives. The                         heart rate, respiratory rate, oxygen saturations,                         blood pressure, adequacy of pulmonary ventilation, and                         response to care were monitored throughout the                         procedure. The physical status of the patient was                         re-assessed after the procedure.                        After obtaining informed consent, the colonoscope was                         passed under direct vision. Throughout the procedure,                         the patient's blood pressure, pulse, and oxygen                         saturations were monitored continuously. The  Colonoscope was introduced through the anus and                         advanced to the the cecum, identified by appendiceal                         orifice and ileocecal valve. The colonoscopy was                         extremely difficult due to multiple diverticula in the                         colon and restricted mobility of the colon. Successful                         completion of the procedure was aided by withdrawing                         the scope and replacing with the pediatric colonoscope                         and applying abdominal pressure. The patient tolerated                          the procedure well. The quality of the bowel                         preparation was evaluated using the BBPS [facility] Bowel                         Preparation Scale) with scores of: Right Colon = 3,                         Transverse Colon = 3 and Left Colon = 3 (entire mucosa                         seen well with no residual staining, small fragments                         of stool or opaque liquid). The total BBPS score                         equals 9. The ileocecal valve, appendiceal orifice,                         and rectum were photographed. Findings:      The perianal and digital rectal examinations were normal. Pertinent       negatives include normal sphincter tone and no palpable rectal lesions.      A 6 mm polyp was found in the cecum. The polyp was sessile. The polyp       was removed with a cold snare. Resection and retrieval were complete.      Two sessile polyps were found in the transverse colon. The polyps were 4       to 5 mm in size. These polyps were removed with a cold snare. Resection       and retrieval  were complete.      Two sessile polyps were found in the descending colon. The polyps were 4       to 5 mm in size. These polyps were removed with a cold snare. Resection       and retrieval were complete.      Three sessile polyps were found in the sigmoid colon. The polyps were 3       to 7 mm in size. These polyps were removed with a cold snare. Resection       and retrieval were complete.      An 8 mm polyp was found in the distal rectum. The polyp was sessile. The       polyp was removed with a cold snare. Resection and retrieval were       complete. To prevent bleeding after the polypectomy, one hemostatic clip       was successfully placed (MR safe). Clip manufacturer: [employer].       There was no bleeding at the end of the procedure.      Multiple large-mouthed diverticula were found in the recto-sigmoid colon       and  sigmoid colon. There was narrowing of the colon in association with       the diverticular opening. There was no evidence of diverticular bleeding.      Non-bleeding external hemorrhoids were found during retroflexion. The       hemorrhoids were medium-sized. Impression:            - One 6 mm polyp in the cecum, removed with a cold                         snare. Resected and retrieved.                        - Two 4 to 5 mm polyps in the transverse colon,                         removed with a cold snare. Resected and retrieved.                        - Two 4 to 5 mm polyps in the descending colon,                         removed with a cold snare. Resected and retrieved.                        - Three 3 to 7 mm polyps in the sigmoid colon, removed                         with a cold snare. Resected and retrieved.                        - One 8 mm polyp in the distal rectum, removed with a                         cold snare. Resected and retrieved. Clip manufacturer:                         [employer]. Clip (MR safe) was placed.                        -  Severe diverticulosis in the recto-sigmoid colon and                         in the sigmoid colon. There was narrowing of the colon                         in association with the diverticular opening. There                         was no evidence of diverticular bleeding.                        - Non-bleeding external hemorrhoids. Recommendation:        - Discharge patient to home (with escort).                        - Resume previous diet today.                        - Continue present medications.                        - Await pathology results.                        - Repeat colonoscopy in 3 years for surveillance of                         multiple polyps. Procedure Code(s):     --- Professional ---                        [phone], Colonoscopy, flexible; with removal of                         tumor(s), polyp(s), or other  lesion(s) by snare                         technique Diagnosis Code(s):     --- Professional ---                        Z12.11, Encounter for screening for malignant neoplasm                         of colon                        D12.0, Benign neoplasm of cecum                        D12.8, Benign neoplasm of rectum                        D12.3, Benign neoplasm of transverse colon (hepatic                         flexure or splenic flexure)                        D12.4, Benign neoplasm of descending colon  D12.5, Benign neoplasm of sigmoid colon                        K64.4, Residual hemorrhoidal skin tags                        K57.30, Diverticulosis of large intestine without                         perforation or abscess without bleeding CPT copyright 2022 American Medical Association. All rights reserved. The codes documented in this report are preliminary and upon coder review may  be revised to meet current compliance requirements. Dr. Angelita Brooklyn Corinn Jess Brooklyn MD, MD 11/10/2023 9:59:51 AM This report has been signed electronically. Number of Addenda: 0 Note Initiated On: 11/10/2023 9:06 AM Scope Withdrawal Time: 0 hours 22 minutes 28 seconds  Total Procedure Duration: 0 hours 32 minutes 32 seconds  Estimated Blood Loss:  Estimated blood loss: none.      [facility]

## 2023-11-10 NOTE — Anesthesia Postprocedure Evaluation (Signed)
 Anesthesia Post Note  Patient: Abigail Reeves  Procedure(s) Performed: COLONOSCOPY WITH PROPOFOL  POLYPECTOMY HEMOSTASIS CLIP PLACEMENT  Patient location during evaluation: Endoscopy Anesthesia Type: General Level of consciousness: awake and alert Pain management: pain level controlled Vital Signs Assessment: post-procedure vital signs reviewed and stable Respiratory status: spontaneous breathing, nonlabored ventilation, respiratory function stable and patient connected to nasal cannula oxygen Cardiovascular status: blood pressure returned to baseline and stable Postop Assessment: no apparent nausea or vomiting Anesthetic complications: no  There were no known notable events for this encounter.   Last Vitals:  Vitals:   11/10/23 1000 11/10/23 1020  BP: (!) 107/58 (!) 147/96  Pulse: 82   Resp: 15   Temp: (!) 36 C   SpO2: 100% 100%    Last Pain:  Vitals:   11/10/23 1020  TempSrc:   PainSc: 0-No pain                 Debby Mines

## 2023-11-10 NOTE — Transfer of Care (Signed)
 Immediate Anesthesia Transfer of Care Note  Patient: Abigail Reeves  Procedure(s) Performed: COLONOSCOPY WITH PROPOFOL  POLYPECTOMY HEMOSTASIS CLIP PLACEMENT  Patient Location: PACU and Endoscopy Unit  Anesthesia Type:General  Level of Consciousness: awake, alert , and patient cooperative  Airway & Oxygen Therapy: Patient Spontanous Breathing  Post-op Assessment: Report given to RN and Post -op Vital signs reviewed and stable  Post vital signs: stable  Last Vitals:  Vitals Value Taken Time  BP    Temp    Pulse    Resp    SpO2      Last Pain:  Vitals:   11/10/23 0816  TempSrc: Temporal  PainSc: 0-No pain         Complications: No notable events documented.

## 2023-11-10 NOTE — Anesthesia Preprocedure Evaluation (Signed)
 Anesthesia Evaluation  Patient identified by MRN, date of birth, ID band Patient awake    Reviewed: Allergy & Precautions, NPO status , Patient's Chart, lab work & pertinent test results  Airway Mallampati: III  TM Distance: >3 FB Neck ROM: full    Dental  (+) Chipped   Pulmonary asthma , Current Smoker   Pulmonary exam normal        Cardiovascular negative cardio ROS Normal cardiovascular exam     Neuro/Psych negative neurological ROS  negative psych ROS   GI/Hepatic negative GI ROS, Neg liver ROS,,,  Endo/Other  negative endocrine ROS    Renal/GU negative Renal ROS  negative genitourinary   Musculoskeletal   Abdominal   Peds  Hematology negative hematology ROS (+)   Anesthesia Other Findings Past Medical History: No date: Cancer (HCC)     Comment:  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, stage 2  Past Surgical History: No date: ABDOMINAL HYSTERECTOMY 2012: AUGMENTATION MAMMAPLASTY; Bilateral 2017: AUGMENTATION MAMMAPLASTY; Right 12-16-15: BREAST BIOPSY; Right     Comment:  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: BREAST EXCISIONAL BIOPSY; Right     Comment:  mastecomy No date: ELBOW SURGERY; Left 2017: MASTECTOMY; Right     Comment:  chemo mastecty with reconstruction  BMI    Body Mass Index: 24.58 kg/m      Reproductive/Obstetrics negative OB ROS                             Anesthesia Physical Anesthesia Plan  ASA: 2  Anesthesia Plan: General   Post-op Pain Management: Minimal or no pain anticipated   Induction: Intravenous  PONV Risk Score and Plan: 3 and Propofol  infusion, TIVA and Ondansetron  Airway Management Planned: Nasal Cannula  Additional Equipment: None  Intra-op Plan:   Post-operative Plan:   Informed Consent: I have reviewed the patients History and Physical, chart, labs and discussed the procedure including the risks, benefits and alternatives  for the proposed anesthesia with the patient or authorized representative who has indicated his/her understanding and acceptance.     Dental advisory given  Plan Discussed with: CRNA and Surgeon  Anesthesia Plan Comments: (Discussed risks of anesthesia with patient, including possibility of difficulty with spontaneous ventilation under anesthesia necessitating airway intervention, PONV, and rare risks such as cardiac or respiratory or neurological events, and allergic reactions. Discussed the role of CRNA in patient's perioperative care. Patient understands.)       Anesthesia Quick Evaluation

## 2023-11-10 NOTE — H&P (Signed)
 Abigail JONELLE Brooklyn, MD [address]  [address]  [place], [place] [address]  Main: [phone]  Fax: [phone] Pager: [phone]  Primary Care Physician:  Emilio Kelly DASEN, FNP (Inactive) Primary Gastroenterologist:  Dr. Corinn JONELLE Reeves  Pre-Procedure History & Physical: HPI:  Abigail Reeves is a 58 y.o. female is here for an colonoscopy.   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 Authorizing Provider  clonazePAM  (KLONOPIN ) 0.5 MG tablet Take 1 tablet (0.5 mg total) by mouth daily as needed. 08/15/23  Yes Emilio Kelly DASEN, FNP  albuterol (VENTOLIN HFA) 108 (90 Base) MCG/ACT inhaler Inhale 1-2 puffs into the lungs every 6 (six) hours as needed for wheezing or shortness of breath. 05/23/23   [provider]    Allergies as of 08/16/2023   (No Known Allergies)    Family History  Problem Relation Age of Onset   Breast cancer Mother        31   Pancreatic cancer Mother        31   Breast cancer Maternal Aunt        79   Breast cancer Maternal Grandmother        ?   Diabetes Father     Social History   Socioeconomic History   Marital status: Single    Spouse name: Not on file   Number of children: 2   Years of education: college   Highest education level: Not on file  Occupational History    Employer: [employer]  Tobacco Use   Smoking status: Every Day    Current packs/day: 1.00    Average packs/day: 1 pack/day for 25.0 years (25.0 ttl pk-yrs)     Types: Cigarettes   Smokeless tobacco: Never  Vaping Use   Vaping status: Some Days  Substance and Sexual Activity   Alcohol use: Yes    Alcohol/week: 20.0 standard drinks of alcohol    Types: 20 Cans of beer per week   Drug use: No   Sexual activity: Not on file  Other Topics Concern   Not on file  Social History Narrative   Not on file   Social Drivers of Health   Financial Resource Strain: Not on file  Food Insecurity: Not on file  Transportation Needs: Not on file  Physical Activity: Not on file  Stress: Not on file  Social Connections: Not on file  Intimate Partner Violence: Not on file    Review of Systems: See HPI, otherwise negative ROS  Physical Exam: BP (!) 143/73   Pulse 88   Temp (!) 96.8 F (36 C) (Temporal)   Resp 18   Ht 5' 5 (1.651 m)   Wt 67 kg   LMP 11/24/1997 (Exact Date)   SpO2 100%   BMI 24.58 kg/m  General:   Alert,  pleasant and cooperative in NAD Head:  Normocephalic and atraumatic. Neck:  Supple; no masses or thyromegaly.  Lungs:  Clear throughout to auscultation.    Heart:  Regular rate and rhythm. Abdomen:  Soft, nontender and nondistended. Normal bowel sounds, without guarding, and without rebound.   Neurologic:  Alert and  oriented x4;  grossly normal neurologically.  Impression/Plan: Abigail Reeves is here for an colonoscopy to be performed for colon cancer screening  Risks, benefits, limitations, and alternatives regarding  colonoscopy have been reviewed with the patient.  Questions have been answered.  All parties agreeable.   Abigail Brooklyn, MD  11/10/2023, 8:42 AM

## 2023-11-13 ENCOUNTER — Encounter: Payer: Self-pay | Admitting: Gastroenterology

## 2023-11-13 LAB — SURGICAL PATHOLOGY

## 2023-11-22 ENCOUNTER — Encounter: Payer: Self-pay | Admitting: Family Medicine

## 2023-11-22 ENCOUNTER — Telehealth: Payer: BC Managed Care – PPO | Admitting: Family Medicine

## 2023-11-22 DIAGNOSIS — R051 Acute cough: Secondary | ICD-10-CM | POA: Diagnosis not present

## 2023-11-22 DIAGNOSIS — F411 Generalized anxiety disorder: Secondary | ICD-10-CM | POA: Diagnosis not present

## 2023-11-22 DIAGNOSIS — F410 Panic disorder [episodic paroxysmal anxiety] without agoraphobia: Secondary | ICD-10-CM

## 2023-11-22 DIAGNOSIS — J0140 Acute pansinusitis, unspecified: Secondary | ICD-10-CM | POA: Diagnosis not present

## 2023-11-22 MED ORDER — CLONAZEPAM 0.5 MG PO TABS: 0.5000 mg | ORAL_TABLET | Freq: Every day | ORAL | 0 refills | Status: DC | PRN

## 2023-11-22 MED ORDER — HYDROXYZINE PAMOATE 25 MG PO CAPS: 25.0000 mg | ORAL_CAPSULE | Freq: Three times a day (TID) | ORAL | 0 refills | Status: DC | PRN

## 2023-11-22 MED ORDER — PROMETHAZINE-DM 6.25-15 MG/5ML PO SYRP: 2.5000 mL | ORAL_SOLUTION | Freq: Four times a day (QID) | ORAL | 0 refills | Status: DC | PRN

## 2023-11-22 MED ORDER — AZELASTINE HCL 0.1 % NA SOLN: 1.0000 | Freq: Two times a day (BID) | NASAL | 3 refills | Status: DC

## 2023-11-22 MED ORDER — AMOXICILLIN-POT CLAVULANATE 875-125 MG PO TABS: 1.0000 | ORAL_TABLET | Freq: Two times a day (BID) | ORAL | 0 refills | Status: AC

## 2023-11-22 NOTE — Assessment & Plan Note (Signed)
Cough d/t to sinus drainage, and start of sore throat Denies wheezing or chest pain Continue as needed albuterol if wheezing occurs Will write for promethazine DM cough relief Continue to take as needed mucinex for sputum expectorant Hot tea with honey, throat lozenges, and salt water gargles

## 2023-11-22 NOTE — Progress Notes (Signed)
Virtual Visit via Video Note  I connected with Abigail Reeves on 11/22/23 at  2:00 PM EST by a video enabled telemedicine application and verified that I am speaking with the correct person using two identifiers.  Patient Location: Home Provider Location: Home Office  I discussed the limitations, risks, security, and privacy concerns of performing an evaluation and management service by video and the availability of in person appointments. I also discussed with the patient that there may be a patient responsible charge related to this service. The patient expressed understanding and agreed to proceed.  Subjective: PCP: Abigail Provencal, FNP  Chief Complaint  Patient presents with   Sinusitis   Anxiety   Abigail Reeves is a 58 year old female who presents with sinus congestion and ear pain. As well as refill on as needed klonopin for panic anxiety.  She has been experiencing sinus congestion and ear pain for seven days, with symptoms primarily affecting her ears and throat. Significant ear pain was noted over the weekend. There is a sensation of fluid in the right ear and decreased hearing, particularly in the left ear due to a past accident. No fever, chills, or difficulty breathing, but there is a burning sensation in the throat due to drainage. Has pressure on her face and states sinuses feel inflamed and tender.   States she has used over-the-counter medications including Sudafed, Mucinex, Nyquil, and nighttime cough medicine. Additionally, she used peroxide and ear drops from [facility] for ear pain relief. She is cautious about medication use due to her history of breast cancer.  Panic anxiety - takes Klonopin 0.5mg  as needed for anxiety, with increased use recently due to stress from caring for her father with dementia and other family responsibilities. She has been using Klonopin on and off since 2018. Her family history includes a son who relapsed after treatment and a daughter with  a long history of drug addiction. She moved back home to care for her father, whose memory is declining. She also has grandchildren to care for and works remotely. Increased family stress in past several months causing increased panic at night before bed. Using klonopin to have sleep and relax nerves.   She also has albuterol but has not needed to use it in two months..    ROS: Per HPI  Current Outpatient Medications:    amoxicillin-clavulanate (AUGMENTIN) 875-125 MG tablet, Take 1 tablet by mouth 2 (two) times daily for 7 days., Disp: 14 tablet, Rfl: 0   azelastine (ASTELIN) 0.1 % nasal spray, Place 1 spray into both nostrils 2 (two) times daily. Use in each nostril as directed, Disp: 30 mL, Rfl: 3   hydrOXYzine (VISTARIL) 25 MG capsule, Take 1 capsule (25 mg total) by mouth every 8 (eight) hours as needed., Disp: 10 capsule, Rfl: 0   promethazine-dextromethorphan (PROMETHAZINE-DM) 6.25-15 MG/5ML syrup, Take 2.5 mLs by mouth 4 (four) times daily as needed for cough., Disp: 118 mL, Rfl: 0   albuterol (VENTOLIN HFA) 108 (90 Base) MCG/ACT inhaler, Inhale 1-2 puffs into the lungs every 6 (six) hours as needed for wheezing or shortness of breath., Disp: , Rfl:    clonazePAM (KLONOPIN) 0.5 MG tablet, Take 1 tablet (0.5 mg total) by mouth daily as needed., Disp: 90 tablet, Rfl: 0  Observations/Objective: There were no vitals filed for this visit. Physical Exam Constitutional:      General: She is not in acute distress.    Appearance: Normal appearance. She is normal weight. She  is not toxic-appearing.     Comments: Hoarse voice and appears co  HENT:     Head: Normocephalic.     Nose:     Right Sinus: Maxillary sinus tenderness and frontal sinus tenderness present.     Left Sinus: Maxillary sinus tenderness and frontal sinus tenderness present.     Comments: Sinus tender and "swollen" to touch when pt asked to feel sinuses during virtual visit Eyes:     Extraocular Movements: Extraocular  movements intact.  Cardiovascular:     Comments: Denies heart palpitations during virtual visit or chest pain Pulmonary:     Effort: Pulmonary effort is normal. No respiratory distress.     Breath sounds: No wheezing.     Comments: No audible abnormal breath sounds during virtual visit Skin:    General: Skin is dry.  Neurological:     General: No focal deficit present.     Mental Status: She is alert and oriented to person, place, and time. Mental status is at baseline.  Psychiatric:        Mood and Affect: Mood normal.        Behavior: Behavior normal.        Thought Content: Thought content normal.        Judgment: Judgment normal.    Assessment and Plan: Generalized anxiety disorder with panic attacks Assessment & Plan: Chronic anxiety managed with intermittent use of Klonopin 0.5mg  prn daily.  Patient has been using Klonopin more frequently due to increased life stressors.  Discussed risks of long-term benzodiazepine use and offered alternatives -  increased sedation, respiratory suppression, falls, extrapyramidal movements, dependence and cardiovascular events. Pt would like to continue treatment, but also interested in trialing different prn for anxiety. Discussed benefits of daily medication for GAD, long discussion. Pt decline daily medication. She is very hesitant to take a medication every day. Trial of Effexor in future if pt is willing -Continue Klonopin 0.5 mg as needed daily. -Prescribe trial of hydroxyzine 25 mg for anxiety to be used as an alternative to Klonopin and wean off benzo. -PDMP reviewed   Orders: -     hydrOXYzine Pamoate; Take 1 capsule (25 mg total) by mouth every 8 (eight) hours as needed.  Dispense: 10 capsule; Refill: 0 -     clonazePAM; Take 1 tablet (0.5 mg total) by mouth daily as needed.  Dispense: 90 tablet; Refill: 0  Acute non-recurrent pansinusitis Assessment & Plan: Sinus tenderness, ear pain, head pain for one week Has tried various OTC  medications no relief Feels very clogged, tender, and pressured on face Appears to have developed sinus infection post viral.  Given virtual visit limited physical exam.  Given time frame - will prescribe Augmentin BID for 7 days.  Azelastine nasal spray for sinus congestion and eustachian tube blockage BID May continue to use as needed tylenol or ibuprofen for pain Hot tea with honey for sore throat Gargle with salt water If symptoms persist or worsen please f/u in person further eval   Orders: -     Amoxicillin-Pot Clavulanate; Take 1 tablet by mouth 2 (two) times daily for 7 days.  Dispense: 14 tablet; Refill: 0 -     Azelastine HCl; Place 1 spray into both nostrils 2 (two) times daily. Use in each nostril as directed  Dispense: 30 mL; Refill: 3  Acute cough Assessment & Plan: Cough d/t to sinus drainage, and start of sore throat Denies wheezing or chest pain Continue as needed albuterol if wheezing  occurs Will write for promethazine DM cough relief Continue to take as needed mucinex for sputum expectorant Hot tea with honey, throat lozenges, and salt water gargles  Orders: -     Promethazine-DM; Take 2.5 mLs by mouth 4 (four) times daily as needed for cough.  Dispense: 118 mL; Refill: 0   Follow Up Instructions: Return in about 5 months (around 04/20/2024) for chronic disease mgmt and labs.   I discussed the assessment and treatment plan with the patient. The patient was provided an opportunity to ask questions, and all were answered. The patient agreed with the plan and demonstrated an understanding of the instructions.   The patient was advised to call back or seek an in-person evaluation if the symptoms worsen or if the condition fails to improve as anticipated.  The above assessment and management plan was discussed with the patient. The patient verbalized understanding of and has agreed to the management plan.   I, Abigail Provencal, FNP, have reviewed all documentation  for this visit. The documentation on 11/22/23 for the exam, diagnosis, procedures, and orders are all accurate and complete.   Abigail Provencal, FNP

## 2023-11-22 NOTE — Assessment & Plan Note (Signed)
Chronic anxiety managed with intermittent use of Klonopin 0.5mg  prn daily.  Patient has been using Klonopin more frequently due to increased life stressors.  Discussed risks of long-term benzodiazepine use and offered alternatives -  increased sedation, respiratory suppression, falls, extrapyramidal movements, dependence and cardiovascular events. Pt would like to continue treatment, but also interested in trialing different prn for anxiety. Discussed benefits of daily medication for GAD, long discussion. Pt decline daily medication. She is very hesitant to take a medication every day. Trial of Effexor in future if pt is willing -Continue Klonopin 0.5 mg as needed daily. -Prescribe trial of hydroxyzine 25 mg for anxiety to be used as an alternative to Klonopin and wean off benzo. -PDMP reviewed

## 2023-11-22 NOTE — Assessment & Plan Note (Addendum)
Sinus tenderness, ear pain, head pain for one week Has tried various OTC medications no relief Feels very clogged, tender, and pressured on face Appears to have developed sinus infection post viral.  Given virtual visit limited physical exam.  Given time frame - will prescribe Augmentin BID for 7 days.  Azelastine nasal spray for sinus congestion and eustachian tube blockage BID May continue to use as needed tylenol or ibuprofen for pain Hot tea with honey for sore throat Gargle with salt water If symptoms persist or worsen please f/u in person further eval

## 2023-11-23 ENCOUNTER — Ambulatory Visit: Payer: BC Managed Care – PPO | Admitting: Family Medicine

## 2023-12-14 ENCOUNTER — Telehealth: Admitting: Physician Assistant

## 2023-12-14 DIAGNOSIS — J069 Acute upper respiratory infection, unspecified: Secondary | ICD-10-CM | POA: Diagnosis not present

## 2023-12-14 MED ORDER — IPRATROPIUM BROMIDE 0.03 % NA SOLN: 2.0000 | Freq: Two times a day (BID) | NASAL | 0 refills | Status: DC

## 2023-12-14 MED ORDER — BENZONATATE 100 MG PO CAPS: 100.0000 mg | ORAL_CAPSULE | Freq: Three times a day (TID) | ORAL | 0 refills | Status: DC | PRN

## 2023-12-14 NOTE — Progress Notes (Signed)
 I have spent 5 minutes in review of e-visit questionnaire, review and updating patient chart, medical decision making and response to patient.   Piedad Climes, PA-C

## 2023-12-14 NOTE — Progress Notes (Signed)

## 2023-12-20 ENCOUNTER — Other Ambulatory Visit: Payer: Self-pay | Admitting: Family Medicine

## 2023-12-20 DIAGNOSIS — F410 Panic disorder [episodic paroxysmal anxiety] without agoraphobia: Secondary | ICD-10-CM

## 2023-12-21 NOTE — Telephone Encounter (Signed)
 Requested medication (s) are due for refill today: yes   Requested medication (s) are on the active medication list: yes   Last refill:  11/22/23 #10 0 refills  Future visit scheduled: yes in 4 months   Notes to clinic:  no refills remain. Do you want to refill Rx?     Requested Prescriptions  Pending Prescriptions Disp Refills   hydrOXYzine (VISTARIL) 25 MG capsule [Pharmacy Med Name: HYDROXYZINE PAMOATE 25 MG CAP] 10 capsule 0    Sig: TAKE 1 CAPSULE BY MOUTH EVERY 8 HOURS ASNEEDED.     Ear, Nose, and Throat:  Antihistamines 2 Failed - 12/21/2023  2:36 PM      Failed - Cr in normal range and within 360 days    Creatinine, Ser  Date Value Ref Range Status  08/15/2023 0.53 (L) 0.57 - 1.00 mg/dL Final         Passed - Valid encounter within last 12 months    Recent Outpatient Visits           4 months ago Annual physical exam   [facility] Jacky Kindle, FNP   1 year ago Annual physical exam   [facility] Jacky Kindle, FNP   2 years ago Swelling of breast   [place] [facility] Jacky Kindle, FNP   3 years ago Annual physical exam   [facility] [place], Alessandra Bevels, [place]   4 years ago GAD (generalized anxiety disorder)   [facility] Vera, [place]andra [place], [place]       Future Appointments             In 4 months Ephriam Knuckles, Jennette Kettle, FNP [facility], [place]

## 2024-02-02 ENCOUNTER — Encounter: Payer: Self-pay | Admitting: Family Medicine

## 2024-02-02 DIAGNOSIS — F410 Panic disorder [episodic paroxysmal anxiety] without agoraphobia: Secondary | ICD-10-CM

## 2024-02-06 MED ORDER — HYDROXYZINE PAMOATE 25 MG PO CAPS: 25.0000 mg | ORAL_CAPSULE | Freq: Three times a day (TID) | ORAL | 1 refills | Status: DC | PRN

## 2024-03-19 ENCOUNTER — Other Ambulatory Visit: Payer: Self-pay | Admitting: Family Medicine

## 2024-03-19 DIAGNOSIS — F410 Panic disorder [episodic paroxysmal anxiety] without agoraphobia: Secondary | ICD-10-CM

## 2024-04-30 ENCOUNTER — Ambulatory Visit (INDEPENDENT_AMBULATORY_CARE_PROVIDER_SITE_OTHER): Payer: BC Managed Care – PPO | Admitting: Family Medicine

## 2024-04-30 ENCOUNTER — Encounter: Payer: Self-pay | Admitting: Family Medicine

## 2024-04-30 VITALS — BP 123/67 | HR 89 | Resp 16 | Wt 152.7 lb

## 2024-04-30 DIAGNOSIS — F411 Generalized anxiety disorder: Secondary | ICD-10-CM | POA: Diagnosis not present

## 2024-04-30 DIAGNOSIS — Z79899 Other long term (current) drug therapy: Secondary | ICD-10-CM | POA: Diagnosis not present

## 2024-04-30 DIAGNOSIS — E663 Overweight: Secondary | ICD-10-CM

## 2024-04-30 DIAGNOSIS — Z789 Other specified health status: Secondary | ICD-10-CM | POA: Diagnosis not present

## 2024-04-30 DIAGNOSIS — F1020 Alcohol dependence, uncomplicated: Secondary | ICD-10-CM | POA: Diagnosis not present

## 2024-04-30 DIAGNOSIS — F410 Panic disorder [episodic paroxysmal anxiety] without agoraphobia: Secondary | ICD-10-CM

## 2024-04-30 DIAGNOSIS — F17209 Nicotine dependence, unspecified, with unspecified nicotine-induced disorders: Secondary | ICD-10-CM | POA: Diagnosis not present

## 2024-04-30 NOTE — Progress Notes (Signed)
 Established Patient Office Visit  Subjective   Patient ID: Abigail Reeves, female    DOB: Apr 15, 1966  Age: 58 y.o. MRN: [id number]  Chief Complaint  Patient presents with   Medical Management of Chronic Issues    Follow-up GAD Patient's brother passed away in March 24, 2024 from Cirrhosis    Discussed the use of AI scribe software for clinical note transcription with the patient, who gave verbal consent to proceed.  History of Present Illness Abigail Reeves is a 58 year old female who presents with concerns about liver health due to alcohol consumption and recent death of brother from cirrhosis due to ETOH use.  Alcohol use and liver health concerns - Consumes a 24-pack of Bud Light weekly, with daily intake varying - History of heavy alcohol use, currently moderated but with recent increase due to stressors - Attended Alcoholics Anonymous meetings for five years - Expresses concern about liver health related to alcohol consumption  Anxiety and benzodiazepine use - Experiences anxiety related to work - Uses Klonopin  as needed, primarily at night to aid restless thoughts - Cautious about dependency and has not filled prescription recently due to time constraints - Does not use Klonopin  daily  Weight management and physical activity - Desires to lose 10 to 15 pounds - Finds it challenging to incorporate exercise into routine due to home and work responsibilities - Currently paces in driveway for about 20 minutes daily while working remotely      04/30/2024    8:10 AM 08/15/2023    1:47 PM 06/09/2022    2:20 PM  Depression screen PHQ 2/9  Decreased Interest 1 0 0  Down, Depressed, Hopeless 1 1 0  PHQ - 2 Score 2 1 0  Altered sleeping 3 2 3   Tired, decreased energy 1 2 0  Change in appetite 0 0 0  Feeling bad or failure about yourself  0 0 0  Trouble concentrating 0 0 0  Moving slowly or fidgety/restless 0 0 0  Suicidal thoughts 0 0 0  PHQ-9 Score 6 5 3   Difficult doing  work/chores Somewhat difficult Somewhat difficult Not difficult at all       04/30/2024    8:10 AM 08/15/2023    1:47 PM 03/01/2017    8:14 AM 01/13/2017    2:51 PM  GAD 7 : Generalized Anxiety Score  Nervous, Anxious, on Edge 3 3 2 1   Control/stop worrying 2 1 2 1   Worry too much - different things 2 1 2 3   Trouble relaxing 2 2 0 2  Restless 0 3 0 3  Easily annoyed or irritable 1 3 0 0  Afraid - awful might happen 0 1 3 2   Total GAD 7 Score 10 14 9 12   Anxiety Difficulty Somewhat difficult Somewhat difficult Not difficult at all Somewhat difficult     Review of Systems  All other systems reviewed and are negative.   Negative unless indicated in HPI   Objective:     BP 123/67 (BP Location: Left Arm, Patient Position: Sitting, Cuff Size: Normal)   Pulse 89   Resp 16   Wt 152 lb 11.2 oz (69.3 kg)   LMP 11/24/1997 (Exact Date)   SpO2 100%   BMI 25.41 kg/m    Physical Exam Constitutional:      General: She is not in acute distress.    Appearance: Normal appearance. She is obese. She is not ill-appearing, toxic-appearing or diaphoretic.  HENT:     Head:  Normocephalic.     Right Ear: Tympanic membrane, ear canal and external ear normal. There is no impacted cerumen.     Left Ear: Tympanic membrane, ear canal and external ear normal. There is no impacted cerumen.     Nose: Nose normal.     Mouth/Throat:     Mouth: Mucous membranes are moist.     Pharynx: Oropharynx is clear.  Eyes:     General:        Right eye: No discharge.        Left eye: No discharge.     Extraocular Movements: Extraocular movements intact.     Pupils: Pupils are equal, round, and reactive to light.  Neck:     Vascular: No carotid bruit.  Cardiovascular:     Rate and Rhythm: Normal rate and regular rhythm.     Pulses: Normal pulses.     Heart sounds: Normal heart sounds. No murmur heard.    No friction rub. No gallop.  Pulmonary:     Effort: No respiratory distress.     Breath sounds: No  stridor. No wheezing, rhonchi or rales.  Chest:     Chest wall: No tenderness.  Musculoskeletal:     Right lower leg: No edema.     Left lower leg: No edema.  Lymphadenopathy:     Cervical: No cervical adenopathy.  Skin:    General: Skin is warm and dry.     Capillary Refill: Capillary refill takes less than 2 seconds.  Neurological:     General: No focal deficit present.     Mental Status: She is alert and oriented to person, place, and time. Mental status is at baseline.     Cranial Nerves: No cranial nerve deficit.     Sensory: No sensory deficit.     Motor: No weakness.     Coordination: Coordination normal.     Gait: Gait normal.  Psychiatric:        Mood and Affect: Mood normal.        Behavior: Behavior normal.        Thought Content: Thought content normal.        Judgment: Judgment normal.      No results found for any visits on 04/30/24.    The ASCVD Risk score (Arnett DK, et al., 2019) failed to calculate for the following reasons:   The valid HDL cholesterol range is 20 to 100 mg/dL    Assessment & Plan:  Alcohol dependence (HCC) -     Comprehensive metabolic panel with GFR -     Lipid panel -     TSH -     VITAMIN D  25 Hydroxy (Vit-D Deficiency, Fractures) -     Vitamin B12 -     Hepatic function panel  Overweight (BMI 25.0-29.9) -     CBC with Differential/Platelet -     Hemoglobin A1c  Generalized anxiety disorder with panic attacks -     TSH  Tobacco use disorder, continuous  Unhealthy alcohol consumption     Assessment and Plan Assessment & Plan Unhealthy alcohol use Chronic alcohol use with recent increase due to stressors. Aware of the need for lifestyle changes and has AA involvement/ resources. Advised against abrupt cessation due to potential withdrawal risks. - Order liver function tests. - Advise gradual reduction of alcohol intake - Pt very Motivated, wants to get healthier - Will check CMP, Lipid, Tsh, Vitamin D  and B12, Liver  function panel  Overweight Desires  to lose 10-15 pounds. Engages in daily physical activity while working. Acknowledges the need for more structured exercise and lifestyle modifications. - Encourage daily exercise, such as walking or use of youtube exercise videos a for 20 minutes. - Advise on lifestyle modifications to support weight loss. - Limit sugary/processed/fatty foods - Alcohol cessation - Will check CBC, A1C, CMP  Anxiety disorder Experiences work-related anxiety. Uses Klonopin  0.5mg   as needed, primarily at night for sleep. Aware of the risks of dependency. Denies use with alcohol - Continue Klonopin  prn nightly - Do not use in combination with alcohol - Benzo risk of increased sedation, falls, forgetfulness dicussed.   Tobacco use - Recommend decreasing/ cessation, pt aware - not ready - Recommend Lung CA screening - will place order    Return in about 6 months (around 10/31/2024) for Chronic Disease Mgmt.   I, Curtis DELENA Boom, FNP, have reviewed all documentation for this visit. The documentation on 04/30/24 for the exam, diagnosis, procedures, and orders are all accurate and complete.    Curtis DELENA Boom, FNP

## 2024-05-01 LAB — CBC WITH DIFFERENTIAL/PLATELET
Basophils Absolute: 0.1 x10E3/uL (ref 0.0–0.2)
Basos: 1 %
EOS (ABSOLUTE): 0 x10E3/uL (ref 0.0–0.4)
Eos: 1 %
Hematocrit: 46.3 % (ref 34.0–46.6)
Hemoglobin: 15.4 g/dL (ref 11.1–15.9)
Immature Grans (Abs): 0 x10E3/uL (ref 0.0–0.1)
Immature Granulocytes: 0 %
Lymphocytes Absolute: 1.7 x10E3/uL (ref 0.7–3.1)
Lymphs: 22 %
MCH: 35.4 pg — ABNORMAL HIGH (ref 26.6–33.0)
MCHC: 33.3 g/dL (ref 31.5–35.7)
MCV: 106 fL — ABNORMAL HIGH (ref 79–97)
Monocytes Absolute: 0.6 x10E3/uL (ref 0.1–0.9)
Monocytes: 8 %
Neutrophils Absolute: 5.1 x10E3/uL (ref 1.4–7.0)
Neutrophils: 67 %
Platelets: 298 x10E3/uL (ref 150–450)
RBC: 4.35 x10E6/uL (ref 3.77–5.28)
RDW: 12.1 % (ref 11.7–15.4)
WBC: 7.5 x10E3/uL (ref 3.4–10.8)

## 2024-05-01 LAB — VITAMIN D 25 HYDROXY (VIT D DEFICIENCY, FRACTURES): Vit D, 25-Hydroxy: 25.6 ng/mL — ABNORMAL LOW (ref 30.0–100.0)

## 2024-05-01 LAB — COMPREHENSIVE METABOLIC PANEL WITH GFR
ALT: 28 IU/L (ref 0–32)
AST: 31 IU/L (ref 0–40)
Albumin: 4.8 g/dL (ref 3.8–4.9)
Alkaline Phosphatase: 87 IU/L (ref 44–121)
BUN/Creatinine Ratio: 12 (ref 9–23)
BUN: 6 mg/dL (ref 6–24)
Bilirubin Total: 0.7 mg/dL (ref 0.0–1.2)
CO2: 21 mmol/L (ref 20–29)
Calcium: 9.7 mg/dL (ref 8.7–10.2)
Chloride: 100 mmol/L (ref 96–106)
Creatinine, Ser: 0.5 mg/dL — ABNORMAL LOW (ref 0.57–1.00)
Globulin, Total: 2.4 g/dL (ref 1.5–4.5)
Glucose: 114 mg/dL — ABNORMAL HIGH (ref 70–99)
Potassium: 3.8 mmol/L (ref 3.5–5.2)
Sodium: 139 mmol/L (ref 134–144)
Total Protein: 7.2 g/dL (ref 6.0–8.5)
eGFR: 109 mL/min/1.73 (ref >59)

## 2024-05-01 LAB — LIPID PANEL
Chol/HDL Ratio: 2.7 ratio (ref 0.0–4.4)
Cholesterol, Total: 274 mg/dL — ABNORMAL HIGH (ref 100–199)
HDL: 101 mg/dL (ref >39)
LDL Chol Calc (NIH): 163 mg/dL — ABNORMAL HIGH (ref 0–99)
Triglycerides: 64 mg/dL (ref 0–149)
VLDL Cholesterol Cal: 10 mg/dL (ref 5–40)

## 2024-05-01 LAB — TSH: TSH: 2.24 u[IU]/mL (ref 0.450–4.500)

## 2024-05-01 LAB — HEPATIC FUNCTION PANEL: Bilirubin, Direct: 0.22 mg/dL (ref 0.00–0.40)

## 2024-05-01 LAB — HEMOGLOBIN A1C
Est. average glucose Bld gHb Est-mCnc: 100 mg/dL
Hgb A1c MFr Bld: 5.1 % (ref 4.8–5.6)

## 2024-05-01 LAB — VITAMIN B12: Vitamin B-12: 396 pg/mL (ref 232–1245)

## 2024-05-02 ENCOUNTER — Ambulatory Visit: Payer: Self-pay | Admitting: Family Medicine

## 2024-07-03 ENCOUNTER — Ambulatory Visit: Admitting: Family Medicine

## 2024-07-16 ENCOUNTER — Other Ambulatory Visit: Payer: Self-pay | Admitting: Family Medicine

## 2024-07-16 DIAGNOSIS — F410 Panic disorder [episodic paroxysmal anxiety] without agoraphobia: Secondary | ICD-10-CM

## 2024-08-15 ENCOUNTER — Encounter: Payer: BC Managed Care – PPO | Admitting: Family Medicine

## 2024-10-31 ENCOUNTER — Encounter: Payer: Self-pay | Admitting: Family Medicine

## 2024-10-31 ENCOUNTER — Ambulatory Visit: Admitting: Family Medicine

## 2024-10-31 VITALS — BP 144/64 | HR 97 | Temp 98.3°F | Ht 65.0 in | Wt 153.3 lb

## 2024-10-31 DIAGNOSIS — Z23 Encounter for immunization: Secondary | ICD-10-CM

## 2024-10-31 DIAGNOSIS — R030 Elevated blood-pressure reading, without diagnosis of hypertension: Secondary | ICD-10-CM

## 2024-10-31 DIAGNOSIS — R739 Hyperglycemia, unspecified: Secondary | ICD-10-CM

## 2024-10-31 DIAGNOSIS — F1020 Alcohol dependence, uncomplicated: Secondary | ICD-10-CM

## 2024-10-31 DIAGNOSIS — E782 Mixed hyperlipidemia: Secondary | ICD-10-CM

## 2024-10-31 DIAGNOSIS — Z1159 Encounter for screening for other viral diseases: Secondary | ICD-10-CM

## 2024-10-31 DIAGNOSIS — D7589 Other specified diseases of blood and blood-forming organs: Secondary | ICD-10-CM

## 2024-10-31 DIAGNOSIS — F410 Panic disorder [episodic paroxysmal anxiety] without agoraphobia: Secondary | ICD-10-CM

## 2024-10-31 DIAGNOSIS — Z79899 Other long term (current) drug therapy: Secondary | ICD-10-CM

## 2024-10-31 DIAGNOSIS — Z1231 Encounter for screening mammogram for malignant neoplasm of breast: Secondary | ICD-10-CM

## 2024-10-31 DIAGNOSIS — L989 Disorder of the skin and subcutaneous tissue, unspecified: Secondary | ICD-10-CM

## 2024-10-31 DIAGNOSIS — F17209 Nicotine dependence, unspecified, with unspecified nicotine-induced disorders: Secondary | ICD-10-CM

## 2024-10-31 NOTE — Progress Notes (Unsigned)
 "     Established patient visit   Patient: Abigail Reeves   DOB: [birth year]   59 y.o. Female  MRN: [id number] Visit Date: 10/31/2024  Today's healthcare provider: LAURAINE LOISE BUOY, DO   Chief Complaint  Patient presents with   Transitions Of Care    Patient is here for a transfer of care was currently one of Kellie's patient.  Hepatitis B Vaccine- antibody testing  Pneumococcal Vaccine- yes Flu Vaccine- yes   Subjective    HPI RONNEISHA JETT is a 59 year old female who presents for a follow-up visit.  She has a history of cancer. Her blood pressure is typically normal at home, ranging from 120/70 to 125/80 mmHg, but tends to be elevated during doctor visits. She has been making lifestyle changes, including reducing smoking and alcohol consumption, and has started going to the gym, resulting in some weight loss.  She lives with her father, a 74 year old disabled veteran recently diagnosed with Parkinson's syndrome. She assists him with his health routine, including monitoring his blood pressure and blood sugar levels.  She has been prescribed clonazepam  for anxiety, primarily using it for sleep issues, but has not used it in the past two months and requires a refill. She is also due for pneumonia and flu vaccines and is unsure about her hepatitis B vaccination status.  She has a history of mildly low vitamin D  and has been taking 1000 mg of vitamin D  inconsistently due to recent events.  Over the past eight months, she has been changing her diet, focusing on organic and natural foods, especially after her father's diagnosis. She has reduced processed foods and increased salads in her diet.  She reports a skin lesion on her face that has been present for a month, which she admits to picking at. She has a history of skin cancer and has had lesions removed in the past.  She has not had a lung cancer screening before and acknowledges the need to make time for it despite her busy  schedule caring for her father and grandchildren.  No chest pain, shortness of breath, headaches, dizziness, runny nose, sore throat, vision changes, or numbness or tingling. She recently got new contacts and has no other vision issues.   ***  {History (Optional):23778}  Medications: Show/hide medication list[1]  Review of Systems  Constitutional:  Negative for chills, fatigue and fever.  HENT:  Negative for congestion, ear pain, rhinorrhea, sneezing and sore throat.   Eyes: Negative.  Negative for pain and redness.  Respiratory:  Negative for cough, shortness of breath and wheezing.   Cardiovascular:  Negative for chest pain and leg swelling.  Gastrointestinal:  Negative for abdominal pain, blood in stool, constipation, diarrhea and nausea.  Endocrine: Negative for polydipsia and polyphagia.  Genitourinary: Negative.  Negative for dysuria, flank pain, hematuria, pelvic pain, vaginal bleeding and vaginal discharge.  Musculoskeletal:  Negative for arthralgias, back pain, gait problem and joint swelling.  Skin:  Negative for rash.       +wound  Neurological: Negative.  Negative for dizziness, tremors, seizures, weakness, light-headedness, numbness and headaches.  Hematological:  Negative for adenopathy.  Psychiatric/Behavioral: Negative.  Negative for behavioral problems, confusion and dysphoric mood. The patient is not nervous/anxious and is not hyperactive.     {Insert previous labs (optional):23779} {See past labs  Heme  Chem  Endocrine  Serology  Results Review (optional):1}   Objective    BP (!) 144/64 (BP Location: Left Arm, Patient Position: Sitting,  Cuff Size: Normal)   Pulse 97   Temp 98.3 F (36.8 C) (Oral)   Ht 5' 5 (1.651 m)   Wt 153 lb 4.8 oz (69.5 kg)   LMP 11/24/1997   SpO2 100%   BMI 25.51 kg/m  {Insert last BP/Wt (optional):23777}{See vitals history (optional):1}   Physical Exam Vitals and nursing note reviewed.  Constitutional:      General: She  is awake.     Appearance: Normal appearance.  HENT:     Head: Normocephalic and atraumatic.     Right Ear: Tympanic membrane, ear canal and external ear normal.     Left Ear: Tympanic membrane, ear canal and external ear normal.     Nose: Nose normal.     Mouth/Throat:     Mouth: Mucous membranes are moist.     Pharynx: Oropharynx is clear. No oropharyngeal exudate or posterior oropharyngeal erythema.  Eyes:     General: No scleral icterus.    Extraocular Movements: Extraocular movements intact.     Conjunctiva/sclera: Conjunctivae normal.     Pupils: Pupils are equal, round, and reactive to light.  Neck:     Thyroid : No thyromegaly or thyroid  tenderness.  Cardiovascular:     Rate and Rhythm: Normal rate and regular rhythm.     Pulses: Normal pulses.     Heart sounds: Normal heart sounds.  Pulmonary:     Effort: Pulmonary effort is normal. No tachypnea, bradypnea or respiratory distress.     Breath sounds: Normal breath sounds. No stridor. No wheezing, rhonchi or rales.  Abdominal:     General: Bowel sounds are normal. There is no distension.     Palpations: Abdomen is soft. There is no mass.     Tenderness: There is no abdominal tenderness. There is no guarding.     Hernia: No hernia is present.  Musculoskeletal:     Cervical back: Normal range of motion and neck supple.     Right lower leg: No edema.     Left lower leg: No edema.  Lymphadenopathy:     Cervical: No cervical adenopathy.  Skin:    General: Skin is warm and dry.  Neurological:     Mental Status: She is alert and oriented to person, place, and time. Mental status is at baseline.  Psychiatric:        Mood and Affect: Mood normal.        Behavior: Behavior normal.      No results found for any visits on 10/31/24.  Assessment & Plan    Generalized anxiety disorder with panic attacks  Elevated serum glucose -     Comprehensive metabolic panel with GFR  Need for influenza vaccination -     Flu vaccine  trivalent PF, 6mos and older(Flulaval,Afluria,Fluarix,Fluzone)  Need for pneumococcal 20-valent conjugate vaccination -     Pneumococcal conjugate vaccine 20-valent  Need for hepatitis B screening test -     Hepatitis B surface antigen; Future  Tobacco use disorder, continuous -     CT CARDIAC SCORING (SELF PAY ONLY); Future -     Comprehensive metabolic panel with GFR -     Ambulatory Referral for Lung Cancer Scre  Mixed hyperlipidemia -     CT CARDIAC SCORING (SELF PAY ONLY); Future -     Lipid panel  Long term prescription benzodiazepine use -     Comprehensive metabolic panel with GFR  Uncomplicated alcohol dependence (HCC) -     Comprehensive metabolic panel with GFR -  Vitamin B1  Macrocytosis -     Folate -     CBC  Skin lesion of cheek -     Ambulatory referral to Dermatology  Encounter for screening mammogram for breast cancer -     3D Screening Mammogram, Left; Future  Elevated blood-pressure reading without diagnosis of hypertension     Annual physical exam Physical exam overall unremarkable except as noted above. Routine lab work ordered as noted.  Moderate cardiovascular risk score of 6.7% over ten years. No diabetes or hypertension. Recent lifestyle changes include smoking reduction, reduced alcohol intake, and dietary modifications. Skin lesion present for one month, history of skin cancer. - Administered pneumonia and flu vaccines. - Ordered hepatitis B antibody. - Ordered coronary artery calcium score CT scan. - Referred for lung cancer screening. - Ordered blood work. - Ordered mammogram for left side. - Documented skin lesion in chart and advised follow-up with dermatologist.  Generalized anxiety disorder with panic attacks Managed with clonazepam  as needed, primarily for sleep. No recent use in two months. - Refill clonazepam  prescription when needed (should still have two 30-day supplies of clonazepam  available at the pharmacy (previous fill  was 90-day supply and dispense was 30-day supply)).  Tobacco use disorder, continuous Currently smoking but actively reducing usage. Discussed benefits of smoking cessation on cardiovascular health. - Encouraged smoking cessation.  Uncomfort alcohol dependence Reducing alcohol intake, currently consuming 4-6 Michelob Ultra per day. Discussed impact of alcohol on B1 absorption. - Recommended B1 supplementation.  Mixed hyperlipidemia Chronic, elevated on last check. ASCVD 6.7%. Not on cholesterol medication by preference. Discussed lifestyle modifications including diet and exercise as primary management strategy. - Encouraged diet and exercise modifications.  Vitamin D  deficiency Previously mildly low vitamin D  levels. Not consistently taking supplements due to personal circumstances. - Recommended 2000 IU of vitamin D  daily. - Recommended 1200 mg of calcium daily for osteoporosis prevention.  Elevated blood pressure reading without diagnosis of hypertension Noted. Home BP 120/70s-80s. Likely white coat syndrome. Continue to monitor. ***  No follow-ups on file.      I discussed the assessment and treatment plan with the patient  The patient was provided an opportunity to ask questions and all were answered. The patient agreed with the plan and demonstrated an understanding of the instructions.   The patient was advised to call back or seek an in-person evaluation if the symptoms worsen or if the condition fails to improve as anticipated.    LAURAINE LOISE BUOY, DO  [facility] [phone] (phone) [phone] (fax)  [facility]      [1] Outpatient Medications Prior to Visit  Medication Sig   clonazePAM  (KLONOPIN ) 0.5 MG tablet TAKE 1 TABLET BY MOUTH EVERY DAY AS NEEDED   No facility-administered medications prior to visit.  "

## 2024-11-01 ENCOUNTER — Telehealth: Payer: Self-pay

## 2024-11-01 NOTE — Telephone Encounter (Signed)
 Copied from CRM #[id number]. Topic: Clinical - Request for Lab/Test Order >> Nov 01, 2024  2:17 PM Mia F wrote: Reason for CRM: Patient was a patient of Dr Donzella (had toc yesterday) and says she would like to have the thyroid  and B12 levels checked. Pt was advised to schedule a TOC to establish care with a new provider but she says she does not want to or feel that that would be necessary since she only want labs done. Pt was made aware that if her pcp is no longer with office she would need a new pcp to get an order. Pt declined an appt and asks for the order and states she will see a dr when the results come in. Please contact pt with order or appt info

## 2024-11-05 LAB — COMPREHENSIVE METABOLIC PANEL WITH GFR
ALT: 16 [IU]/L (ref 0–32)
AST: 20 [IU]/L (ref 0–40)
Albumin: 4.7 g/dL (ref 3.8–4.9)
Alkaline Phosphatase: 85 [IU]/L (ref 49–135)
BUN/Creatinine Ratio: 15 (ref 9–23)
BUN: 8 mg/dL (ref 6–24)
Bilirubin Total: 0.6 mg/dL (ref 0.0–1.2)
CO2: 20 mmol/L (ref 20–29)
Calcium: 9.7 mg/dL (ref 8.7–10.2)
Chloride: 101 mmol/L (ref 96–106)
Creatinine, Ser: 0.53 mg/dL — ABNORMAL LOW (ref 0.57–1.00)
Globulin, Total: 2.3 g/dL (ref 1.5–4.5)
Glucose: 102 mg/dL — ABNORMAL HIGH (ref 70–99)
Potassium: 4.6 mmol/L (ref 3.5–5.2)
Sodium: 139 mmol/L (ref 134–144)
Total Protein: 7 g/dL (ref 6.0–8.5)
eGFR: 106 mL/min/{1.73_m2}

## 2024-11-05 LAB — CBC
Hematocrit: 41.3 % (ref 34.0–46.6)
Hemoglobin: 14.5 g/dL (ref 11.1–15.9)
MCH: 36.9 pg — ABNORMAL HIGH (ref 26.6–33.0)
MCHC: 35.1 g/dL (ref 31.5–35.7)
MCV: 105 fL — ABNORMAL HIGH (ref 79–97)
Platelets: 272 10*3/uL (ref 150–450)
RBC: 3.93 x10E6/uL (ref 3.77–5.28)
RDW: 12 % (ref 11.7–15.4)
WBC: 8.8 10*3/uL (ref 3.4–10.8)

## 2024-11-05 LAB — LIPID PANEL
Chol/HDL Ratio: 3.5 ratio (ref 0.0–4.4)
Cholesterol, Total: 250 mg/dL — ABNORMAL HIGH (ref 100–199)
HDL: 71 mg/dL
LDL Chol Calc (NIH): 169 mg/dL — ABNORMAL HIGH (ref 0–99)
Triglycerides: 60 mg/dL (ref 0–149)
VLDL Cholesterol Cal: 10 mg/dL (ref 5–40)

## 2024-11-05 LAB — FOLATE: Folate: 6.2 ng/mL

## 2024-11-05 LAB — VITAMIN B1: Thiamine: 120.6 nmol/L (ref 66.5–200.0)

## 2024-11-06 ENCOUNTER — Encounter: Payer: Self-pay | Admitting: Family Medicine

## 2024-11-08 ENCOUNTER — Ambulatory Visit: Admission: RE | Admit: 2024-11-08

## 2024-11-08 DIAGNOSIS — E782 Mixed hyperlipidemia: Secondary | ICD-10-CM

## 2024-11-08 DIAGNOSIS — F17209 Nicotine dependence, unspecified, with unspecified nicotine-induced disorders: Secondary | ICD-10-CM

## 2024-11-14 ENCOUNTER — Ambulatory Visit

## 2024-12-03 ENCOUNTER — Encounter
